# Patient Record
Sex: Female | Born: 1955 | Race: Black or African American | Hispanic: No | Marital: Single | Smoking: Former smoker
Health system: Southern US, Community
[De-identification: ages and names within clinical notes are randomized; demographics above are authoritative.]

## PROBLEM LIST (undated history)

## (undated) DIAGNOSIS — M1990 Unspecified osteoarthritis, unspecified site: Secondary | ICD-10-CM

## (undated) HISTORY — DX: Unspecified osteoarthritis, unspecified site: M19.90

---

## 1999-02-04 ENCOUNTER — Emergency Department (HOSPITAL_COMMUNITY): Admission: EM | Admit: 1999-02-04 | Discharge: 1999-02-04 | Payer: Self-pay | Admitting: Emergency Medicine

## 2001-02-03 ENCOUNTER — Emergency Department (HOSPITAL_COMMUNITY): Admission: EM | Admit: 2001-02-03 | Discharge: 2001-02-03 | Payer: Self-pay | Admitting: *Deleted

## 2001-03-14 ENCOUNTER — Emergency Department (HOSPITAL_COMMUNITY): Admission: EM | Admit: 2001-03-14 | Discharge: 2001-03-14 | Payer: Self-pay | Admitting: Emergency Medicine

## 2002-07-21 ENCOUNTER — Encounter: Payer: Self-pay | Admitting: Emergency Medicine

## 2002-07-21 ENCOUNTER — Emergency Department (HOSPITAL_COMMUNITY): Admission: EM | Admit: 2002-07-21 | Discharge: 2002-07-21 | Payer: Self-pay | Admitting: Emergency Medicine

## 2005-06-21 ENCOUNTER — Emergency Department (HOSPITAL_COMMUNITY): Admission: EM | Admit: 2005-06-21 | Discharge: 2005-06-21 | Payer: Self-pay | Admitting: Emergency Medicine

## 2006-02-05 ENCOUNTER — Ambulatory Visit: Payer: Self-pay | Admitting: Internal Medicine

## 2006-02-05 LAB — CONVERTED CEMR LAB
AST: 27 units/L (ref 0–37)
Albumin: 3.5 g/dL (ref 3.5–5.2)
Amylase: 67 units/L (ref 27–131)
BUN: 13 mg/dL (ref 6–23)
Basophils Absolute: 0 10*3/uL (ref 0.0–0.1)
Bilirubin, Direct: 0.1 mg/dL (ref 0.0–0.3)
Calcium: 8.8 mg/dL (ref 8.4–10.5)
Eosinophils Absolute: 0.3 10*3/uL (ref 0.0–0.6)
GFR calc non Af Amer: 71 mL/min
Glucose, Bld: 112 mg/dL — ABNORMAL HIGH (ref 70–99)
HCT: 41.1 % (ref 36.0–46.0)
Hemoglobin: 13.7 g/dL (ref 12.0–15.0)
Lipase: 27 units/L (ref 11.0–59.0)
Lymphocytes Relative: 28.1 % (ref 12.0–46.0)
MCHC: 33.5 g/dL (ref 30.0–36.0)
Neutro Abs: 3.7 10*3/uL (ref 1.4–7.7)
Neutrophils Relative %: 59.4 % (ref 43.0–77.0)
Potassium: 3.3 meq/L — ABNORMAL LOW (ref 3.5–5.1)
RDW: 13.6 % (ref 11.5–14.6)
WBC: 6.2 10*3/uL (ref 4.5–10.5)

## 2006-02-06 ENCOUNTER — Encounter: Payer: Self-pay | Admitting: Internal Medicine

## 2006-02-06 LAB — CONVERTED CEMR LAB
Ketones, ur: NEGATIVE mg/dL
Protein, ur: 30 mg/dL — AB
Specific Gravity, Urine: 1.036 — ABNORMAL HIGH (ref 1.005–1.03)
Urine Glucose: NEGATIVE mg/dL

## 2006-02-09 ENCOUNTER — Ambulatory Visit: Payer: Self-pay | Admitting: Internal Medicine

## 2006-03-19 ENCOUNTER — Ambulatory Visit: Payer: Self-pay | Admitting: Internal Medicine

## 2006-04-10 ENCOUNTER — Other Ambulatory Visit: Admission: RE | Admit: 2006-04-10 | Discharge: 2006-04-10 | Payer: Self-pay | Admitting: Gynecology

## 2009-04-05 ENCOUNTER — Emergency Department (HOSPITAL_COMMUNITY): Admission: EM | Admit: 2009-04-05 | Discharge: 2009-04-05 | Payer: Self-pay | Admitting: Family Medicine

## 2009-07-21 ENCOUNTER — Encounter: Payer: Self-pay | Admitting: Internal Medicine

## 2009-07-21 ENCOUNTER — Ambulatory Visit: Payer: Self-pay | Admitting: Internal Medicine

## 2009-07-21 ENCOUNTER — Other Ambulatory Visit: Admission: RE | Admit: 2009-07-21 | Discharge: 2009-07-21 | Payer: Self-pay | Admitting: Internal Medicine

## 2009-07-21 DIAGNOSIS — R631 Polydipsia: Secondary | ICD-10-CM | POA: Insufficient documentation

## 2009-07-21 DIAGNOSIS — M25559 Pain in unspecified hip: Secondary | ICD-10-CM | POA: Insufficient documentation

## 2009-07-21 LAB — HM PAP SMEAR

## 2009-08-03 ENCOUNTER — Encounter (INDEPENDENT_AMBULATORY_CARE_PROVIDER_SITE_OTHER): Payer: Self-pay | Admitting: *Deleted

## 2009-08-26 ENCOUNTER — Encounter: Payer: Self-pay | Admitting: Internal Medicine

## 2009-09-03 ENCOUNTER — Encounter (INDEPENDENT_AMBULATORY_CARE_PROVIDER_SITE_OTHER): Payer: Self-pay | Admitting: *Deleted

## 2009-09-08 ENCOUNTER — Ambulatory Visit: Payer: Self-pay | Admitting: Internal Medicine

## 2009-09-16 ENCOUNTER — Encounter: Payer: Self-pay | Admitting: Internal Medicine

## 2009-09-16 LAB — HM MAMMOGRAPHY: HM Mammogram: NORMAL

## 2009-09-21 ENCOUNTER — Telehealth: Payer: Self-pay | Admitting: Internal Medicine

## 2009-09-22 ENCOUNTER — Ambulatory Visit: Payer: Self-pay | Admitting: Internal Medicine

## 2009-09-22 LAB — HM COLONOSCOPY

## 2009-09-28 ENCOUNTER — Encounter: Payer: Self-pay | Admitting: Internal Medicine

## 2010-01-30 LAB — CONVERTED CEMR LAB
ALT: 13 units/L (ref 0–35)
AST: 17 units/L (ref 0–37)
Basophils Relative: 0.3 % (ref 0.0–3.0)
Bilirubin, Direct: 0.1 mg/dL (ref 0.0–0.3)
Chloride: 107 meq/L (ref 96–112)
GFR calc non Af Amer: 132.54 mL/min (ref >60)
Glucose, Bld: 86 mg/dL (ref 70–99)
HCT: 39.5 % (ref 36.0–46.0)
Hemoglobin, Urine: NEGATIVE
Hemoglobin: 13.2 g/dL (ref 12.0–15.0)
Ketones, ur: NEGATIVE mg/dL
Lymphocytes Relative: 40.1 % (ref 12.0–46.0)
MCHC: 33.3 g/dL (ref 30.0–36.0)
Monocytes Relative: 6.1 % (ref 3.0–12.0)
Neutro Abs: 4.9 10*3/uL (ref 1.4–7.7)
Nitrite: NEGATIVE
Pap Smear: NEGATIVE
Platelets: 450 10*3/uL — ABNORMAL HIGH (ref 150.0–400.0)
Potassium: 4.7 meq/L (ref 3.5–5.1)
RBC: 4.45 M/uL (ref 3.87–5.11)
Sodium: 138 meq/L (ref 135–145)
Specific Gravity, Urine: 1.02 (ref 1.000–1.030)
Total Protein, Urine: NEGATIVE mg/dL
Total Protein: 6.8 g/dL (ref 6.0–8.3)
Urobilinogen, UA: 1 (ref 0.0–1.0)
WBC: 9.6 10*3/uL (ref 4.5–10.5)

## 2010-02-03 NOTE — Letter (Signed)
Summary: Results Follow-up Letter  [facility]  [address] [place], [place] [address]   Phone: [phone]  Fax: [phone]    07/21/2009  [address] [place], [place]  [address]  Dear Ms. Laird,   The following are the results of your recent test(s):  Test     Result     Hip xray     normal CBC       slightly high platelet count Liver/kidney   normal Blood sugars   normal Thyroid     normal   _________________________________________________________  Please call for an appointment as directed _________________________________________________________ _________________________________________________________ _________________________________________________________  Sincerely,  Sanda Linger MD [facility]

## 2010-02-03 NOTE — Assessment & Plan Note (Signed)
Summary: NPX/AETNA/#/CD   Vital Signs:  Patient profile:   55 year old female Height:      64 inches Weight:      179.25 pounds BMI:     30.88 O2 Sat:      96 % on Room air Temp:     98.7 degrees F oral Pulse rate:   81 / minute Pulse rhythm:   regular Resp:     16 per minute BP sitting:   120 / 72  (left arm) Cuff size:   large  Vitals Entered By: Rock Nephew CMA (July 21, 2009 9:04 AM)  Nutrition Counseling: Patient's BMI is greater than 25 and therefore counseled on weight management options.  O2 Flow:  Room air  Primary Care Provider:  Etta Grandchild MD   History of Present Illness: New to me for a complete physical with a complaint of left hip pain for over a year with no preceding trauma or injury. She describes it as an intermittent severe ache that does not change with position or activity. The pain is located over the GT and it occasionally radiates into her left thigh. The pain resolves with OTC meds.   Preventive Screening-Counseling & Management  Alcohol-Tobacco     Alcohol drinks/day: 1     Alcohol type: all     >5/day in last 3 mos: no     Alcohol Counseling: not indicated; use of alcohol is not excessive or problematic     Feels need to cut down: no     Feels annoyed by complaints: no     Feels guilty re: drinking: no     Needs 'eye opener' in am: no     Smoking Status: current     Smoking Cessation Counseling: yes     Smoke Cessation Stage: precontemplative     Packs/Day: 1.0     Year Started: 1977     Pack years: 40     Tobacco Counseling: to quit use of tobacco products  Caffeine-Diet-Exercise     Does Patient Exercise: yes  Hep-HIV-STD-Contraception     Hepatitis Risk: no risk noted     HIV Risk: no risk noted     STD Risk: no risk noted     SBE monthly: yes     SBE Education/Counseling: to perform regular SBE  Safety-Violence-Falls     Seat Belt Use: yes     Helmet Use: yes     Firearms in the Home: no firearms in the home  Smoke Detectors: yes     Violence in the Home: no risk noted     Sexual Abuse: no      Sexual History:  currently monogamous.        Blood Transfusions:  no.    Current Medications (verified): 1)  Advil 200 Mg Tabs (Ibuprofen) .... 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 Current Smoker Alcohol use-yes Drug use-no Regular exercise-yes Does Patient Exercise:  yes Risk analyst Use:  yes Smoking Status:  current Packs/Day:  1.0 HIV Risk:  no risk noted Hepatitis Risk:  no risk noted STD Risk:  no risk noted Sexual History:  currently monogamous Blood Transfusions:  no  Review of Systems  The patient denies anorexia, fever, weight loss, weight gain, hoarseness, chest pain, syncope, dyspnea on exertion, peripheral edema, prolonged cough, headaches, hemoptysis, abdominal pain, melena, hematochezia, severe indigestion/heartburn, hematuria, suspicious skin lesions, enlarged lymph nodes, angioedema, and breast masses.   MS:  Complains of joint pain; denies joint redness, joint swelling, loss of strength, low back pain, mid back pain, muscle aches, muscle weakness, and stiffness. Endo:  Complains of excessive thirst; denies cold intolerance, excessive hunger, excessive urination, heat intolerance, polyuria, and weight change.  Physical Exam  General:  alert, well-developed, well-nourished, well-hydrated, appropriate dress, normal appearance, healthy-appearing, cooperative to examination, and good hygiene.  alert, well-developed, well-nourished, well-hydrated, appropriate dress, normal appearance,  healthy-appearing, cooperative to examination, and good hygiene.   Head:  normocephalic, atraumatic, no abnormalities observed, and no abnormalities palpated.  normocephalic, atraumatic, no abnormalities observed, and no abnormalities palpated.   Eyes:  vision grossly intact, pupils equal, pupils round, pupils reactive to light, and no injection.  vision grossly intact, pupils equal, pupils round, pupils reactive to light, and no injection.   Ears:  R ear normal and L ear normal.  R ear normal and L ear normal.   Nose:  External nasal examination shows no deformity or inflammation. Nasal mucosa are pink and moist without lesions or exudates. Mouth:  pharynx pink and moist, no exudates, no lesions, no erosions, and edentulous.  pharynx pink and moist, no exudates, no lesions, and no erosions.   Neck:  supple, full ROM, no masses, no thyromegaly, no thyroid nodules or tenderness, no JVD, normal carotid upstroke, no carotid bruits, no cervical lymphadenopathy, and no neck tenderness.  supple, full ROM, no masses, no thyromegaly, no thyroid nodules or tenderness, no JVD, normal carotid upstroke, no carotid bruits, no cervical lymphadenopathy, and no neck tenderness.   Chest Wall:  No deformities, masses, or tenderness noted. Breasts:  No mass, nodules, thickening, tenderness, bulging, retraction, inflamation, nipple discharge or skin changes noted.   Lungs:  Normal respiratory effort, chest expands symmetrically. Lungs are clear to auscultation, no crackles or wheezes. Heart:  Normal rate and regular rhythm. S1 and S2 normal without gallop, murmur, click, rub or other extra sounds. Abdomen:  soft, non-tender, normal bowel sounds, no distention, no masses, no guarding, no rigidity, no rebound tenderness, no abdominal hernia, no inguinal hernia, no hepatomegaly, and no splenomegaly.  soft, non-tender, normal bowel sounds, no distention, no masses, no guarding, no rigidity, no rebound tenderness, no abdominal  hernia, no inguinal hernia, no hepatomegaly, and no splenomegaly.   Rectal:  No external abnormalities noted. Normal sphincter tone. No rectal masses or tenderness. Genitalia:  normal introitus, no external lesions, no vaginal discharge, mucosa pink and moist, no vaginal or cervical lesions, no vaginal atrophy, no friaility or hemorrhage, normal uterus size and position, and no adnexal masses or tenderness.  normal introitus, no external lesions, no vaginal discharge, mucosa pink and moist, no vaginal or cervical lesions, no vaginal atrophy, no friaility or hemorrhage, normal uterus size and position, and no adnexal masses or tenderness.   Msk:  normal ROM, no joint tenderness, no joint swelling, no joint warmth, no redness over joints, no joint deformities, no joint instability, no crepitation, and no muscle atrophy.  normal ROM, no joint tenderness, no joint swelling, no joint warmth, no redness over joints, no joint deformities, no joint instability, no crepitation, and no muscle atrophy.   Pulses:  R and L carotid,radial,femoral,dorsalis pedis and posterior tibial pulses are full and  equal bilaterally Extremities:  No clubbing, cyanosis, edema, or deformity noted with normal full range of motion of all joints.   Neurologic:  No cranial nerve deficits noted. Station and gait are normal. Plantar reflexes are down-going bilaterally. DTRs are symmetrical throughout. Sensory, motor and coordinative functions appear intact. Skin:  turgor normal, color normal, no rashes, no suspicious lesions, no ecchymoses, no petechiae, no purpura, no ulcerations, no edema, and tattoo(s).  turgor normal, color normal, no rashes, no suspicious lesions, no ecchymoses, no petechiae, no purpura, no ulcerations, and no edema.   Cervical Nodes:  no anterior cervical adenopathy and no posterior cervical adenopathy.  no anterior cervical adenopathy and no posterior cervical adenopathy.   Axillary Nodes:  no R axillary adenopathy and  no L axillary adenopathy.  no R axillary adenopathy and no L axillary adenopathy.   Inguinal Nodes:  no R inguinal adenopathy and no L inguinal adenopathy.  no R inguinal adenopathy and no L inguinal adenopathy.   Psych:  Cognition and judgment appear intact. Alert and cooperative with normal attention span and concentration. No apparent delusions, illusions, hallucinations   Impression & Recommendations:  Problem # 1:  TOBACCO USE (ICD-305.1) Assessment New  Orders: Tobacco use cessation intermediate 3-10 minutes (04540)  Encouraged smoking cessation and discussed different methods for smoking cessation.   Problem # 2:  ROUTINE GENERAL MEDICAL EXAM@HEALTH  CARE FACL (ICD-V70.0)  Orders: Venipuncture (98119) TLB-Lipid Panel (80061-LIPID) TLB-BMP (Basic Metabolic Panel-BMET) (80048-METABOL) TLB-CBC Platelet - w/Differential (85025-CBCD) TLB-Hepatic/Liver Function Pnl (80076-HEPATIC) TLB-TSH (Thyroid Stimulating Hormone) (84443-TSH) TLB-A1C / Hgb A1C (Glycohemoglobin) (83036-A1C) TLB-Udip w/ Micro (81001-URINE) Hemoccult Guaiac-1 spec.(in office) (82270) EKG w/ Interpretation (93000) Radiology Referral (Radiology) Gastroenterology Referral (GI)  Discussed using sunscreen, use of alcohol, drug use, self breast exam, routine dental care, routine eye care, schedule for GYN exam, routine physical exam, seat belts, multiple vitamins, osteoporosis prevention, adequate calcium intake in diet, recommendations for immunizations, mammograms and Pap smears.  Discussed exercise and checking cholesterol.   Problem # 3:  POLYDIPSIA (ICD-783.5) Assessment: New  Orders: Venipuncture (14782) TLB-Lipid Panel (80061-LIPID) TLB-BMP (Basic Metabolic Panel-BMET) (80048-METABOL) TLB-CBC Platelet - w/Differential (85025-CBCD) TLB-Hepatic/Liver Function Pnl (80076-HEPATIC) TLB-TSH (Thyroid Stimulating Hormone) (84443-TSH) TLB-A1C / Hgb A1C (Glycohemoglobin) (83036-A1C) TLB-Udip w/ Micro  (81001-URINE)  Problem # 4:  HIP PAIN, LEFT (ICD-719.45) Assessment: New  Her updated medication list for this problem includes:    Advil 200 Mg Tabs (Ibuprofen) .Marland Kitchen... As needed  Orders: Venipuncture (95621) TLB-Lipid Panel (80061-LIPID) TLB-BMP (Basic Metabolic Panel-BMET) (80048-METABOL) TLB-CBC Platelet - w/Differential (85025-CBCD) TLB-Hepatic/Liver Function Pnl (80076-HEPATIC) TLB-TSH (Thyroid Stimulating Hormone) (84443-TSH) TLB-A1C / Hgb A1C (Glycohemoglobin) (83036-A1C) TLB-Udip w/ Micro (81001-URINE) Tobacco use cessation intermediate 3-10 minutes (30865) T-Hip Comp Left Min 2-views (73510TC)  Complete Medication List: 1)  Advil 200 Mg Tabs (Ibuprofen) .... As needed  Colorectal Screening:  Current Recommendations:    Hemoccult: NEG X 1 today    Colonoscopy recommended: scheduled with G.I.  PAP Screening:    Reviewed PAP smear recommendations:  PAP smear done  Mammogram Screening:    Reviewed Mammogram recommendations:  mammogram ordered  Osteoporosis Risk Assessment:  Risk Factors for Fracture or Low Bone Density:   Smoking status:       current  Patient Instructions: 1)  Please schedule a follow-up appointment in 1 month. 2)  Tobacco is very bad for your health and your loved ones! You Should stop smoking!. 3)  Stop Smoking Tips: Choose a Quit date. Cut down before the Quit date. decide what you will  do as a substitute when you feel the urge to smoke(gum,toothpick,exercise). 4)  It is important that you exercise regularly at least 20 minutes 5 times a week. If you develop chest pain, have severe difficulty breathing, or feel very tired , stop exercising immediately and seek medical attention. 5)  It is not healthy  for men to drink more than 2-3 drinks per day or for women to drink more than 1-2 drinks per day.

## 2010-02-03 NOTE — Letter (Signed)
Summary: Lipid Letter  [facility]  [address] [place], [place] [address]   Phone: [phone]  Fax: [phone]    07/21/2009  Melanie Ewing [address] [place], [place]  [address]  Dear Melanie Ewing:  We have carefully reviewed your last lipid profile from 07/21/2009 and the results are noted below with a summary of recommendations for lipid management.    Cholesterol:       170     Goal: <200   HDL "good" Cholesterol:   57.84     Goal: >40   LDL "bad" Cholesterol:   108     Goal: <130   Triglycerides:       55.0     Goal: <150        TLC Diet (Therapeutic Lifestyle Change): Saturated Fats & Transfatty acids should be kept < 7% of total calories ***Reduce Saturated Fats Polyunstaurated Fat can be up to 10% of total calories Monounsaturated Fat Fat can be up to 20% of total calories Total Fat should be no greater than 25-35% of total calories Carbohydrates should be 50-60% of total calories Protein should be approximately 15% of total calories Fiber should be at least 20-30 grams a day ***Increased fiber may help lower LDL Total Cholesterol should be < 200mg /day Consider adding plant stanol/sterols to diet (example: Benacol spread) ***A higher intake of unsaturated fat may reduce Triglycerides and Increase HDL    Adjunctive Measures (may lower LIPIDS and reduce risk of Heart Attack) include: Aerobic Exercise (20-30 minutes 3-4 times a week) Limit Alcohol Consumption Weight Reduction Aspirin 75-81 mg a day by mouth (if not allergic or contraindicated) Dietary Fiber 20-30 grams a day by mouth     Current Medications: 1)    Advil 200 Mg Tabs (Ibuprofen) .... As needed  If you have any questions, please call. We appreciate being able to work with you.   Sincerely,    [facility] Etta Grandchild MD

## 2010-02-03 NOTE — Miscellaneous (Signed)
Summary: [facility] Previsit/prep  Clinical Lists Changes  Medications: Added new medication of MOVIPREP 100 GM  SOLR (PEG-KCL-NACL-NASULF-NA ASC-C) As per prep instructions. - Signed Rx of MOVIPREP 100 GM  SOLR (PEG-KCL-NACL-NASULF-NA ASC-C) As per prep instructions.;  #1 x 0;  Signed;  Entered by: Emerson Monte RN;  Authorized by: Gatha Mayer MD, [facility];  Method used: Electronically to [facility] [id number]*, [address], [place], [place]  [address], Ph: [phone], Fax: [phone] Observations: Added new observation of NKA: T (09/08/2009 8:07)    Prescriptions: MOVIPREP 100 GM  SOLR (PEG-KCL-NACL-NASULF-NA ASC-C) As per prep instructions.  #1 x 0   Entered by:   Emerson Monte RN   Authorized by:   Gatha Mayer MD, [facility]   Signed by:   Emerson Monte RN on 09/08/2009   Method used:   Electronically to        [facility] [phone]* (retail)       [address]       [place], [place]  [address]       Ph: [phone]       Fax: [phone]   RxID:   [id number]

## 2010-02-03 NOTE — Progress Notes (Signed)
    PAP Screening:    Last PAP smear:  07/21/2009  Mammogram Screening:    Last Mammogram:  09/16/2009  Mammogram Results:    Date of Exam:  09/16/2009    Results:  Normal Bilateral  Osteoporosis Risk Assessment:  Risk Factors for Fracture or Low Bone Density:   Smoking status:       current

## 2010-02-03 NOTE — Procedures (Signed)
Summary: Colonoscopy  Patient: Melanie Ewing Note: All result statuses are Final unless otherwise noted.  Tests: (1) Colonoscopy (COL)   COL Colonoscopy           DONE     [facility]     [address]     [place], [place]  [address]           COLONOSCOPY PROCEDURE REPORT           PATIENT:  Melanie Ewing, Melanie Ewing  MR#:  [id number]     BIRTHDATE:  Jul 29, 1955, 54 yrs. old  GENDER:  female     ENDOSCOPIST:  Iva Boop, MD, [facility]     REF. BY:  Etta Grandchild, M.D.     PROCEDURE DATE:  09/22/2009     PROCEDURE:  Colonoscopy with biopsy and snare polypectomy     ASA CLASS:  Class I     INDICATIONS:  Routine Risk Screening     MEDICATIONS:   Fentanyl 50 mcg IV, Versed 7 mg IV           DESCRIPTION OF PROCEDURE:   After the risks benefits and     alternatives of the procedure were thoroughly explained, informed     consent was obtained.  Digital rectal exam was performed and     revealed no abnormalities.   The LB [id number] endoscope     was introduced through the anus and advanced to the cecum, which     was identified by both the appendix and ileocecal valve, without     limitations.  The quality of the prep was excellent, using     MoviPrep.  The instrument was then slowly withdrawn as the colon     was fully examined. Insertion: 2:29 minutes withdrawal: 11:02     minutes     <<PROCEDUREIMAGES>>           FINDINGS:  Two polyps were found. They  were diminutive. Splenic     flexure (? polyp) removed with biopsy forceps (partially     recovered) and sigmoid removed via cold snare and recovered.  This     was otherwise a normal examination of the colon. This includes     right colon retroflexion.   Retroflexed views in the rectum     revealed internal hemorrhoids.    The scope was then withdrawn     from the patient and the procedure completed.           COMPLICATIONS:  None     ENDOSCOPIC IMPRESSION:     1) Two diminutive left colon polyps removed     2)  Internal hemorrhoids     3) Otherwise normal examination, excellent prep           REPEAT EXAM:  In for Colonoscopy, pending biopsy results.           Iva Boop, MD, [facility]           CC:  Etta Grandchild, MD     The Patient           n.     Rosalie Doctor:   Iva Boop at 09/22/2009 10:22 AM           Jarvis Newcomer, [id number]  Note: An exclamation mark (!) indicates a result that was not dispersed into the flowsheet. Document Creation Date: 09/22/2009 10:23 AM _______________________________________________________________________  (1) Order result status: Final Collection or observation date-time: 09/22/2009 10:13 Requested date-time:  Receipt date-time:  Reported date-time:  Referring Physician:   Ordering Physician: Stan Head [phone]) Specimen Source:  Source: Launa Grill Order Number: [id number] Lab site:   Appended Document: Colonoscopy     Procedures Next Due Date:    Colonoscopy: 10/2019

## 2010-02-03 NOTE — Letter (Signed)
Summary: Previsit letter  [facility]  [place], [place] [address]   Phone: [phone]  Fax: [phone]       08/03/2009 MRN: [id number]  [facility] [address] [place], [place]  [address]  Dear Melanie Ewing,  Welcome to the Gastroenterology Division at [facility].    You are scheduled to see a nurse for your pre-procedure visit on September 08, 2009 at 8:30am on the 3rd floor at [facility] [place] [facility].  We ask that you try to arrive at our office 15 minutes prior to your appointment time to allow for check-in.  Your nurse visit will consist of discussing your medical and surgical history, your immediate family medical history, and your medications.    Please bring a complete list of all your medications or, if you prefer, bring the medication bottles and we will list them.  We will need to be aware of both prescribed and over the counter drugs.  We will need to know exact dosage information as well.  If you are on blood thinners (Coumadin, Plavix, Aggrenox, Ticlid, etc.) please call our office today/prior to your appointment, as we need to consult with your physician about holding your medication.   Please be prepared to read and sign documents such as consent forms, a financial agreement, and acknowledgement forms.  If necessary, and with your consent, a friend or relative is welcome to sit-in on the nurse visit with you.  Please bring your insurance card so that we may make a copy of it.  If your insurance requires a referral to see a specialist, please bring your referral form from your primary care physician.  No co-pay is required for this nurse visit.     If you cannot keep your appointment, please call [phone] to cancel or reschedule prior to your appointment date.  This allows [place] the opportunity to schedule an appointment for another patient in need of care.    Thank you for choosing [facility] for  your medical needs.  We appreciate the opportunity to care for you.  Please visit [place] at our website  to learn more about our practice.                     Sincerely.                                                                                                                   The Gastroenterology Division

## 2010-02-03 NOTE — Letter (Signed)
Summary: Patient Notice-Hyperplastic Polyps  [facility]  [address] [place], [place] [address]   Phone: [phone]  Fax: [phone]        September 28, 2009 MRN: [id number]    [facility] [address] 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  These types of polyps are NOT pre-cancerous.  It is therefore my recommendation that you have a repeat colonoscopy examination in 10 years for routine colorectal cancer screening.  Should you develop new or worsening symptoms of abdominal pain, bowel habit changes or bleeding from the rectum or bowels, please schedule an evaluation with either your primary care physician or with me.  Please call us if you are having persistent problems or have questions about your condition that have not been fully answered at this time.  Sincerely,  Iva Boop MD, [facility] This letter has been electronically signed by your physician.  Appended Document: Patient Notice-Hyperplastic Polyps letter mailed

## 2010-02-03 NOTE — Letter (Signed)
Summary: [facility] Instructions  [facility]  [address] [place], [place] [address]   Phone: [phone]  Fax: [phone]       Melanie Ewing    07-29-1955    MRN: [id number]        Procedure Day Dorna Bloom: Wednesday 09-22-09     Arrival Time: 8:30 a.m.     Procedure Time: 9:30 a.m.     Location of Procedure:                    _x _  [facility] (4th Floor)                       PREPARATION FOR COLONOSCOPY WITH MOVIPREP   Starting 5 days prior to your procedure  09-17-09 do not eat nuts, seeds, popcorn, corn, beans, peas,  salads, or any raw vegetables.  Do not take any fiber supplements (e.g. Metamucil, Citrucel, and Benefiber).  THE DAY BEFORE YOUR PROCEDURE         DATE:  09-21-09  DAY: Tuesday  1.  Drink clear liquids the entire day-NO SOLID FOOD  2.  Do not drink anything colored red or purple.  Avoid juices with pulp.  No orange juice.  3.  Drink at least 64 oz. (8 glasses) of fluid/clear liquids during the day to prevent dehydration and help the prep work efficiently.  CLEAR LIQUIDS INCLUDE: Water Jello Ice Popsicles Tea (sugar ok, no milk/cream) Powdered fruit flavored drinks Coffee (sugar ok, no milk/cream) Gatorade Juice: apple, white grape, white cranberry  Lemonade Clear bullion, consomm, broth Carbonated beverages (any kind) Strained chicken noodle soup Hard Candy                             4.  In the morning, mix first dose of MoviPrep solution:    Empty 1 Pouch A and 1 Pouch B into the disposable container    Add lukewarm drinking water to the top line of the container. Mix to dissolve    Refrigerate (mixed solution should be used within 24 hrs)  5.  Begin drinking the prep at 5:00 p.m. The MoviPrep container is divided by 4 marks.   Every 15 minutes drink the solution down to the next mark (approximately 8 oz) until the full liter is complete.   6.  Follow completed prep with 16 oz of clear liquid of your choice  (Nothing red or purple).  Continue to drink clear liquids until bedtime.  7.  Before going to bed, mix second dose of MoviPrep solution:    Empty 1 Pouch A and 1 Pouch B into the disposable container    Add lukewarm drinking water to the top line of the container. Mix to dissolve    Refrigerate  THE DAY OF YOUR PROCEDURE      DATE:  09-22-09   DAY: Wednesday  Beginning at  4:30 a.m. (5 hours before procedure):         1. Every 15 minutes, drink the solution down to the next mark (approx 8 oz) until the full liter is complete.  2. Follow completed prep with 16 oz. of clear liquid of your choice.    3. You may drink clear liquids until  7:30 a.m.  (2 HOURS BEFORE PROCEDURE).   MEDICATION INSTRUCTIONS  Unless otherwise instructed, you should take regular prescription medications with a small sip of water  as early as possible the morning of your procedure.         OTHER INSTRUCTIONS  You will need a responsible adult at least 53 years of age to accompany you and drive you home.   This person must remain in the waiting room during your procedure.  Wear loose fitting clothing that is easily removed.  Leave jewelry and other valuables at home.  However, you may wish to bring a book to read or  an iPod/MP3 player to listen to music as you wait for your procedure to start.  Remove all body piercing jewelry and leave at home.  Total time from sign-in until discharge is approximately 2-3 hours.  You should go home directly after your procedure and rest.  You can resume normal activities the  day after your procedure.  The day of your procedure you should not:   Drive   Make legal decisions   Operate machinery   Drink alcohol   Return to work  You will receive specific instructions about eating, activities and medications before you leave.    The above instructions have been reviewed and explained to me by  Wyona Almas RN  September 08, 2009 9:01 AM     I  fully understand and can verbalize these instructions _____________________________ Date _________

## 2010-06-22 ENCOUNTER — Encounter: Payer: Self-pay | Admitting: Internal Medicine

## 2010-06-23 ENCOUNTER — Encounter: Payer: Self-pay | Admitting: Internal Medicine

## 2010-06-23 ENCOUNTER — Ambulatory Visit (INDEPENDENT_AMBULATORY_CARE_PROVIDER_SITE_OTHER): Payer: Managed Care, Other (non HMO) | Admitting: Internal Medicine

## 2010-06-23 ENCOUNTER — Other Ambulatory Visit (INDEPENDENT_AMBULATORY_CARE_PROVIDER_SITE_OTHER): Payer: Managed Care, Other (non HMO)

## 2010-06-23 DIAGNOSIS — R631 Polydipsia: Secondary | ICD-10-CM

## 2010-06-23 DIAGNOSIS — H9190 Unspecified hearing loss, unspecified ear: Secondary | ICD-10-CM | POA: Insufficient documentation

## 2010-06-23 DIAGNOSIS — D75839 Thrombocytosis, unspecified: Secondary | ICD-10-CM

## 2010-06-23 DIAGNOSIS — R109 Unspecified abdominal pain: Secondary | ICD-10-CM

## 2010-06-23 DIAGNOSIS — F17200 Nicotine dependence, unspecified, uncomplicated: Secondary | ICD-10-CM

## 2010-06-23 DIAGNOSIS — D473 Essential (hemorrhagic) thrombocythemia: Secondary | ICD-10-CM

## 2010-06-23 DIAGNOSIS — Z23 Encounter for immunization: Secondary | ICD-10-CM

## 2010-06-23 LAB — CBC WITH DIFFERENTIAL/PLATELET
Basophils Absolute: 0.1 10*3/uL (ref 0.0–0.1)
HCT: 40.8 % (ref 36.0–46.0)
Lymphocytes Relative: 40.9 % (ref 12.0–46.0)
Lymphs Abs: 4.2 10*3/uL — ABNORMAL HIGH (ref 0.7–4.0)
Monocytes Relative: 6.9 % (ref 3.0–12.0)
Neutrophils Relative %: 49.5 % (ref 43.0–77.0)
Platelets: 455 10*3/uL — ABNORMAL HIGH (ref 150.0–400.0)
RDW: 15.9 % — ABNORMAL HIGH (ref 11.5–14.6)

## 2010-06-23 LAB — URINALYSIS, ROUTINE W REFLEX MICROSCOPIC
Bilirubin Urine: NEGATIVE
Leukocytes, UA: NEGATIVE
Urine Glucose: NEGATIVE
Urobilinogen, UA: 1 (ref 0.0–1.0)
pH: 6 (ref 5.0–8.0)

## 2010-06-23 LAB — COMPREHENSIVE METABOLIC PANEL
ALT: 22 U/L (ref 0–35)
AST: 17 U/L (ref 0–37)
Calcium: 9.6 mg/dL (ref 8.4–10.5)
Chloride: 107 mEq/L (ref 96–112)
Creatinine, Ser: 0.8 mg/dL (ref 0.4–1.2)
Sodium: 141 mEq/L (ref 135–145)

## 2010-06-23 LAB — HEMOGLOBIN A1C: Hgb A1c MFr Bld: 6.2 % (ref 4.6–6.5)

## 2010-06-23 LAB — TSH: TSH: 1.13 u[IU]/mL (ref 0.35–5.50)

## 2010-06-23 NOTE — Progress Notes (Signed)
Subjective:    Patient ID: Melanie Ewing, female    DOB: 06-20-1955, 55 y.o.   MRN: [id number]  Flank Pain This is a chronic problem. The current episode started more than 1 month ago. The problem occurs intermittently. The problem has been gradually worsening since onset. The quality of the pain is described as aching and stabbing. The pain does not radiate. The pain is at a severity of 3/10. The pain is mild. The pain is worse during the night. The symptoms are aggravated by lying down. Pertinent negatives include no abdominal pain, bladder incontinence, bowel incontinence, chest pain, dysuria, fever, headaches, leg pain, numbness, paresis, paresthesias, pelvic pain, perianal numbness, tingling, weakness or weight loss.      Review of Systems  Constitutional: Negative for fever, chills, weight loss, diaphoresis, activity change, appetite change, fatigue and unexpected weight change.  HENT: Positive for hearing loss and tinnitus. Negative for ear pain, nosebleeds, congestion, sore throat, facial swelling, rhinorrhea, sneezing, drooling, mouth sores, trouble swallowing, neck pain, neck stiffness, dental problem, voice change, postnasal drip, sinus pressure and ear discharge.   Eyes: Negative for photophobia and visual disturbance.  Respiratory: Negative for apnea, cough, choking, chest tightness, shortness of breath, wheezing and stridor.   Cardiovascular: Negative for chest pain, palpitations and leg swelling.  Gastrointestinal: Negative for nausea, vomiting, abdominal pain, diarrhea, constipation, blood in stool, abdominal distention, anal bleeding, rectal pain and bowel incontinence.  Genitourinary: Positive for frequency and flank pain. Negative for bladder incontinence, dysuria, urgency, hematuria, decreased urine volume, vaginal bleeding, vaginal discharge, enuresis, difficulty urinating, genital sores, vaginal pain, menstrual problem, pelvic pain and dyspareunia.  Musculoskeletal:  Negative for myalgias, back pain, joint swelling, arthralgias and gait problem.  Skin: Negative for color change, pallor, rash and wound.  Neurological: Negative for dizziness, tingling, tremors, seizures, syncope, facial asymmetry, speech difficulty, weakness, light-headedness, numbness, headaches and paresthesias.  Hematological: Negative for adenopathy. Does not bruise/bleed easily.  Psychiatric/Behavioral: Negative.        Objective:   Physical Exam  Vitals reviewed. Constitutional: She is oriented to person, place, and time. She appears well-developed and well-nourished. No distress.  HENT:  Head: Normocephalic and atraumatic.  Right Ear: External ear normal.  Left Ear: External ear normal.  Nose: Nose normal.  Mouth/Throat: Oropharynx is clear and moist. No oropharyngeal exudate.  Eyes: Conjunctivae and EOM are normal. Pupils are equal, round, and reactive to light. Right eye exhibits no discharge. Left eye exhibits no discharge. No scleral icterus.  Neck: Normal range of motion. Neck supple. No JVD present. No tracheal deviation present. No thyromegaly present.  Cardiovascular: Normal rate, regular rhythm, normal heart sounds and intact distal pulses.  Exam reveals no gallop and no friction rub.   No murmur heard. Pulmonary/Chest: Effort normal and breath sounds normal. No stridor. No respiratory distress. She has no wheezes. She has no rales. She exhibits no tenderness.  Abdominal: Soft. Bowel sounds are normal. She exhibits no distension and no mass. There is no tenderness. There is no rebound and no guarding.  Musculoskeletal: Normal range of motion. She exhibits no edema and no tenderness.  Lymphadenopathy:    She has no cervical adenopathy.  Neurological: She is alert and oriented to person, place, and time. She has normal reflexes. She displays normal reflexes. No cranial nerve deficit. She exhibits normal muscle tone. Coordination normal.  Skin: Skin is warm and dry. No rash  noted. She is not diaphoretic. No erythema. No pallor.  Psychiatric: She has a normal mood  and affect. Her behavior is normal. Judgment and thought content normal.        Lab Results  Component Value Date   WBC 9.6 07/21/2009   HGB 13.2 07/21/2009   HCT 39.5 07/21/2009   PLT 450.0* 07/21/2009   CHOL 170 07/21/2009   TRIG 55.0 07/21/2009   HDL 50.90 07/21/2009   ALT 13 07/21/2009   AST 17 07/21/2009   NA 138 07/21/2009   K 4.7 07/21/2009   CL 107 07/21/2009   CREATININE 0.6 07/21/2009   BUN 15 07/21/2009   CO2 25 07/21/2009   TSH 1.14 07/21/2009   HGBA1C 6.0 07/21/2009    Assessment & Plan:

## 2010-06-23 NOTE — Patient Instructions (Signed)
Flank Pain Flank pain refers to pain that is located on the side of the body between the upper abdomen and the back. It can be caused by many things. CAUSES Some of the more common causes of flank pain include:  Muscle strain.   Muscle spasms.   A disease of your spine (vertebral disk disease).   A lung infection (pneumonia).   Fluid around your lungs (pulmonary edema).   A kidney infection.   Kidney stones.   A very painful skin rash on only one side of your body (shingles).   Gallbladder disease.  DIAGNOSIS Blood tests, urine tests, and X-rays may help your caregiver determine what is wrong. TREATMENT The treatment of pain depends on the cause. Your caregiver will determine what treatment will work best for you. HOME CARE INSTRUCTIONS  Home care will depend on the cause of your pain.   Some medications may help relieve the pain. Take medication for relief of pain as directed by your caregiver.   Tell your caregiver about any changes in your pain.   Follow up with your caregiver.  SEEK IMMEDIATE MEDICAL CARE IF:  Your pain is not controlled with medication.   The pain increases.   You have abdominal pain.   You have shortness of breath.   You have persistent nausea or vomiting.   You have swelling in your abdomen.   You feel faint or pass out.   You have a temperature by mouth above 100.5, not controlled by medicine.  MAKE SURE YOU:  Understand these instructions.   Will watch your condition.   Will get help right away if you are not doing well or get worse.  Document Released: 02/09/2005 Document Re-Released: 06/08/2009 [facility] Patient Information 2011 [place], [place].

## 2010-06-27 LAB — PROTEIN ELECTROPHORESIS, SERUM
Albumin ELP: 55.9 % (ref 55.8–66.1)
Alpha-1-Globulin: 4.6 % (ref 2.9–4.9)
Alpha-2-Globulin: 9.4 % (ref 7.1–11.8)
Gamma Globulin: 17.8 % (ref 11.1–18.8)

## 2010-06-28 ENCOUNTER — Telehealth: Payer: Self-pay

## 2010-06-28 NOTE — Telephone Encounter (Signed)
Patient notified of lab results per Md 

## 2010-06-28 NOTE — Telephone Encounter (Signed)
Message copied by Sandi Mealy on Tue Jun 28, 2010  9:17 AM ------      Message from: Rene Paci A      Created: Fri Jun 24, 2010  4:01 PM       Reviewed for urgency - Please call patient: tests show normal or stable results. No treatment changes recommended. - thanks.

## 2010-06-28 NOTE — Progress Notes (Signed)
Patient notified of lab results per Md

## 2010-07-02 NOTE — Assessment & Plan Note (Signed)
Labs ordered to look for other causes of this

## 2010-07-02 NOTE — Assessment & Plan Note (Signed)
This is suspicious for pathology in the spleen/kidneys, etc so today I will check labs and ask her to do a CT scan with contrast to look at the size and texture of her spleen and to look for stones in her kidneys

## 2010-07-02 NOTE — Assessment & Plan Note (Signed)
I will check labs to see what the trend of the platelet count is and will look at a SPEP to screen her for lymphoproliferative disease

## 2010-07-02 NOTE — Assessment & Plan Note (Signed)
Audiology referral for formal eval of her hearing

## 2010-07-07 ENCOUNTER — Other Ambulatory Visit: Payer: Self-pay | Admitting: Internal Medicine

## 2010-07-07 ENCOUNTER — Ambulatory Visit (INDEPENDENT_AMBULATORY_CARE_PROVIDER_SITE_OTHER)
Admission: RE | Admit: 2010-07-07 | Discharge: 2010-07-07 | Disposition: A | Discharge status: Home / Self Care | Payer: Managed Care, Other (non HMO) | Source: Ambulatory Visit | Attending: Internal Medicine | Admitting: Internal Medicine

## 2010-07-07 DIAGNOSIS — D473 Essential (hemorrhagic) thrombocythemia: Secondary | ICD-10-CM

## 2010-07-07 DIAGNOSIS — D75839 Thrombocytosis, unspecified: Secondary | ICD-10-CM

## 2010-07-07 DIAGNOSIS — R109 Unspecified abdominal pain: Secondary | ICD-10-CM

## 2010-07-07 MED ORDER — IOHEXOL 300 MG/ML  SOLN: 100.0000 mL | Freq: Once | INTRAMUSCULAR | Status: AC | PRN

## 2010-07-13 ENCOUNTER — Encounter: Payer: Self-pay | Admitting: Internal Medicine

## 2011-08-28 ENCOUNTER — Emergency Department (INDEPENDENT_AMBULATORY_CARE_PROVIDER_SITE_OTHER)
Admission: EM | Admit: 2011-08-28 | Discharge: 2011-08-28 | Disposition: A | Discharge status: Home / Self Care | Payer: Self-pay | Source: Home / Self Care | Attending: Emergency Medicine | Admitting: Emergency Medicine

## 2011-08-28 ENCOUNTER — Encounter (HOSPITAL_COMMUNITY): Payer: Self-pay

## 2011-08-28 ENCOUNTER — Emergency Department (INDEPENDENT_AMBULATORY_CARE_PROVIDER_SITE_OTHER): Payer: Self-pay

## 2011-08-28 DIAGNOSIS — S8263XA Displaced fracture of lateral malleolus of unspecified fibula, initial encounter for closed fracture: Secondary | ICD-10-CM

## 2011-08-28 MED ORDER — HYDROCODONE-ACETAMINOPHEN 5-325 MG PO TABS: ORAL_TABLET | ORAL | Status: AC

## 2011-08-28 NOTE — Progress Notes (Signed)
Orthopedic Tech Progress Note Patient Details:  Melanie Ewing [birth year] [id number] Short Leg splint applied to Left LE--stirrup splint Ortho Devices Type of Ortho Device: Stirrup splint Ortho Device/Splint Location: Applied to Left LE Ortho Device/Splint Interventions: Application   Asia R Thompson 08/28/2011, 2:27 PM

## 2011-08-28 NOTE — ED Notes (Signed)
Pt states she slipped on her garage stairs on 08/06/11 and injured lt ankle.  C/o pain and swelling to lt ankle and pain shooting up into lower leg.

## 2011-08-28 NOTE — ED Provider Notes (Signed)
Chief Complaint  Patient presents with  . Ankle Pain    History of Present Illness:   Melanie Ewing is a 56 year old female with a 21 day history of left ankle pain. This began when she slipped on a couple of steps and fell. She's had swelling and pain to palpation over the lateral malleolus, although she has been able to ambulate. She denies any numbness or tingling.  Review of Systems:  Other than noted above, the patient denies any of the following symptoms: Systemic:  No fevers, chills, sweats, or aches.  No fatigue or tiredness. Musculoskeletal:  No joint pain, arthritis, bursitis, swelling, back pain, or neck pain. Neurological:  No muscular weakness, paresthesias, headache, or trouble with speech or coordination.  No dizziness.  PMFSH:  Past medical history, family history, social history, meds, and allergies were reviewed.  Physical Exam:   Vital signs:  BP 143/79  Pulse 64  Temp 98.1 F (36.7 C) (Oral)  Resp 16  SpO2 100% Gen:  Alert and oriented times 3.  In no distress. Musculoskeletal: There is pain to palpation over the lateral malleolus but not much swelling. The ankle has a full range of motion with slight pain. Talar tilt and anterior drawer signs were negative. Otherwise, all joints had a full a ROM with no swelling, bruising or deformity.  No edema, pulses full. Extremities were warm and pink.  Capillary refill was brisk.  Skin:  Clear, warm and dry.  No rash. Neuro:  Alert and oriented times 3.  Muscle strength was normal.  Sensation was intact to light touch.   Radiology:  Dg Ankle Complete Left  08/28/2011  *RADIOLOGY REPORT*  Clinical Data: Lateral malleolar pain, fell 08/06/2011  LEFT ANKLE COMPLETE - 3+ VIEW  Comparison: None  Findings: Osseous mineralization normal. Soft tissue swelling at lateral ankle. Minimally displaced oblique fracture of the lateral malleolus. Ankle mortise intact. No additional fracture or dislocation identified. Tiny plantar calcaneal spurs.   IMPRESSION: Minimally displaced oblique fracture of the lateral malleolus.   Original Report Authenticated By: Lollie Marrow, M.D.      I reviewed the images independently and personally and concur with the radiologist's findings.  Course in Urgent Care Center:   She was placed in a short leg stirrup splint and given crutches for ambulation.  Assessment:  The encounter diagnosis was Fractured lateral malleolus.  Plan:   1.  The following meds were prescribed:   New Prescriptions   HYDROCODONE-ACETAMINOPHEN (NORCO/VICODIN) 5-325 MG PER TABLET    1 to 2 tabs every 4 to 6 hours as needed for pain.   2.  The patient was instructed in symptomatic care, including rest and activity, elevation, application of ice and compression.  Appropriate handouts were given. 3.  The patient was told to return if becoming worse in any way, if no better in 3 or 4 days, and given some red flag symptoms that would indicate earlier return.   4.  The patient was told to follow up with Dr. Leonides Grills in one week.   Reuben Likes, MD 08/28/11 2149

## 2011-09-14 ENCOUNTER — Emergency Department (HOSPITAL_COMMUNITY)
Admission: EM | Admit: 2011-09-14 | Discharge: 2011-09-14 | Disposition: A | Discharge status: Home / Self Care | Payer: Self-pay | Attending: Emergency Medicine | Admitting: Emergency Medicine

## 2011-09-14 ENCOUNTER — Encounter (HOSPITAL_COMMUNITY): Payer: Self-pay | Admitting: *Deleted

## 2011-09-14 DIAGNOSIS — F17200 Nicotine dependence, unspecified, uncomplicated: Secondary | ICD-10-CM | POA: Insufficient documentation

## 2011-09-14 DIAGNOSIS — Z8261 Family history of arthritis: Secondary | ICD-10-CM | POA: Insufficient documentation

## 2011-09-14 DIAGNOSIS — S93609A Unspecified sprain of unspecified foot, initial encounter: Secondary | ICD-10-CM

## 2011-09-14 DIAGNOSIS — Z8249 Family history of ischemic heart disease and other diseases of the circulatory system: Secondary | ICD-10-CM | POA: Insufficient documentation

## 2011-09-14 DIAGNOSIS — X58XXXA Exposure to other specified factors, initial encounter: Secondary | ICD-10-CM | POA: Insufficient documentation

## 2011-09-14 DIAGNOSIS — Z823 Family history of stroke: Secondary | ICD-10-CM | POA: Insufficient documentation

## 2011-09-14 DIAGNOSIS — Z833 Family history of diabetes mellitus: Secondary | ICD-10-CM | POA: Insufficient documentation

## 2011-09-14 DIAGNOSIS — Z8489 Family history of other specified conditions: Secondary | ICD-10-CM | POA: Insufficient documentation

## 2011-09-14 MED ORDER — TRAMADOL HCL 50 MG PO TABS
50.0000 mg | ORAL_TABLET | Freq: Once | ORAL | Status: AC
  Administered 2011-09-14: 50 mg via ORAL
  Filled 2011-09-14: qty 1

## 2011-09-14 MED ORDER — TRAMADOL HCL 50 MG PO TABS: 50.0000 mg | ORAL_TABLET | Freq: Once | ORAL | Status: AC

## 2011-09-14 NOTE — ED Provider Notes (Signed)
History     CSN: [id number]  Arrival date & time 09/14/11  1904   First MD Initiated Contact with Patient 09/14/11 2043      Chief Complaint  Patient presents with  . Ankle Pain    (Consider location/radiation/quality/duration/timing/severity/associated sxs/prior treatment) HPI Comments: Proximal fourth patient had a lateral malleolus fracture of the left ankle.  She did not followup with orthopedics.  She still wearing the posterior splint, and Ace bandage placed in the emergency department.  She has been using crutches for the past 2, days.  She complains that her right foot, and ankle have been painful.  Has not taken any over-the-counter medication.  Prior to arrival.  She denies any fall or mechanism of injury to the right foot/  Patient is a 56 y.o. female presenting with ankle pain. The history is provided by the patient.  Ankle Pain  The incident occurred more than 2 days ago. The incident occurred at home. There was no injury mechanism. The pain is present in the right foot. The pain is at a severity of 5/10. The pain is moderate. The pain has been constant since onset. Pertinent negatives include no numbness, no loss of motion, no muscle weakness and no tingling.    History reviewed. No pertinent past medical history.  History reviewed. No pertinent past surgical history.  Family History  Problem Relation Age of Onset  . Arthritis Other   . Diabetes Other   . Hyperlipidemia Other   . Hypertension Other   . Stroke Other     History  Substance Use Topics  . Smoking status: Current Every Day Smoker  . Smokeless tobacco: Not on file  . Alcohol Use: Yes    OB History    Grav Para Term Preterm Abortions TAB SAB Ect Mult Living                  Review of Systems  Constitutional: Negative for fever.  Musculoskeletal: Positive for gait problem. Negative for joint swelling.  Skin: Negative for wound.  Neurological: Negative for dizziness, tingling, weakness and  numbness.    Allergies  Review of patient's allergies indicates no known allergies.  Home Medications   Current Outpatient Rx  Name Route Sig Dispense Refill  . BIOTIN 1000 MCG PO TABS Oral Take 1,000 mcg by mouth daily.    . OMEGA-3 FATTY ACIDS 1000 MG PO CAPS Oral Take 1 g by mouth daily.    Marland Kitchen HYDROCODONE-ACETAMINOPHEN 5-325 MG PO TABS Oral Take 1 tablet by mouth every 6 (six) hours as needed. For pain    . IBUPROFEN 200 MG PO TABS Oral Take 200-800 mg by mouth every 6 (six) hours as needed. For pain    . ADULT MULTIVITAMIN W/MINERALS CH Oral Take 1 tablet by mouth daily.    . TRAMADOL HCL 50 MG PO TABS Oral Take 1 tablet (50 mg total) by mouth once. 30 tablet 0    BP 136/81  Pulse 74  Temp 99 F (37.2 C) (Oral)  Resp 18  SpO2 100%  Physical Exam  Constitutional: She appears well-developed and well-nourished.  Neck: Normal range of motion.  Cardiovascular: Normal rate.   Musculoskeletal: Normal range of motion.       Feet:  Neurological: She is alert.  Skin: Skin is warm.    ED Course  Procedures (including critical care time)  Labs Reviewed - No data to display No results found.   1. Foot sprain  MDM  Filled the pain in this patient's right foot is from compensating for the left ankle fracture.  I will place her in an ASO provided additional pain support and have patient began attempt to followup with orthopedics         Arman Filter, NP 09/14/11 2114  Arman Filter, NP 09/14/11 2115

## 2011-09-14 NOTE — ED Notes (Signed)
Pt states she noticed swelling to R ankle yesterday evening. L ankle is in a supportive device because "it's broken." Unable to compare ankles to each other to assess for swelling.  Slight redness noted anterior surface of R ankle.

## 2011-09-14 NOTE — Progress Notes (Signed)
Orthopedic Tech Progress Note Patient Details:  Melanie Ewing 17-Dec-1955 [id number]  Ortho Devices Type of Ortho Device: ASO Ortho Device/Splint Location: (R) LE Ortho Device/Splint Interventions: Application   Jennye Moccasin 09/14/2011, 9:19 PM

## 2011-09-14 NOTE — ED Notes (Signed)
The pt has had rt ankle pain for 2-3 days no known injury.

## 2011-09-15 NOTE — ED Provider Notes (Signed)
Medical screening examination/treatment/procedure(s) were performed by non-physician practitioner and as supervising physician I was immediately available for consultation/collaboration.   Delora Fuel, MD 08/88/35 [id number]

## 2012-01-16 ENCOUNTER — Encounter (HOSPITAL_COMMUNITY): Payer: Self-pay | Admitting: *Deleted

## 2012-01-16 ENCOUNTER — Emergency Department (HOSPITAL_COMMUNITY)
Admission: EM | Admit: 2012-01-16 | Discharge: 2012-01-17 | Disposition: A | Discharge status: Home / Self Care | Payer: Self-pay | Attending: Emergency Medicine | Admitting: Emergency Medicine

## 2012-01-16 DIAGNOSIS — L259 Unspecified contact dermatitis, unspecified cause: Secondary | ICD-10-CM | POA: Insufficient documentation

## 2012-01-16 DIAGNOSIS — L299 Pruritus, unspecified: Secondary | ICD-10-CM | POA: Insufficient documentation

## 2012-01-16 DIAGNOSIS — R238 Other skin changes: Secondary | ICD-10-CM | POA: Insufficient documentation

## 2012-01-16 DIAGNOSIS — R109 Unspecified abdominal pain: Secondary | ICD-10-CM | POA: Insufficient documentation

## 2012-01-16 DIAGNOSIS — E119 Type 2 diabetes mellitus without complications: Secondary | ICD-10-CM | POA: Insufficient documentation

## 2012-01-16 DIAGNOSIS — K921 Melena: Secondary | ICD-10-CM | POA: Insufficient documentation

## 2012-01-16 DIAGNOSIS — Z79899 Other long term (current) drug therapy: Secondary | ICD-10-CM | POA: Insufficient documentation

## 2012-01-16 DIAGNOSIS — F17200 Nicotine dependence, unspecified, uncomplicated: Secondary | ICD-10-CM | POA: Insufficient documentation

## 2012-01-16 NOTE — ED Notes (Signed)
Pt reports lower abd pain that hurts worse when getting up and in the morning x2 months - once ambulating pain decreases. Pt also admits to irregular rash occuring on her arms and face. Pt admits to some itching. Pt also c/o HA. Pt denies fever.

## 2012-01-17 LAB — URINALYSIS, ROUTINE W REFLEX MICROSCOPIC
Bilirubin Urine: NEGATIVE
Glucose, UA: NEGATIVE mg/dL
Hgb urine dipstick: NEGATIVE
Specific Gravity, Urine: 1.024 (ref 1.005–1.030)
pH: 5 (ref 5.0–8.0)

## 2012-01-17 LAB — URINE MICROSCOPIC-ADD ON

## 2012-01-17 MED ORDER — HYDROXYZINE HCL 25 MG PO TABS
25.0000 mg | ORAL_TABLET | Freq: Once | ORAL | Status: AC
  Administered 2012-01-17: 25 mg via ORAL
  Filled 2012-01-17: qty 1

## 2012-01-17 MED ORDER — HYDROXYZINE HCL 25 MG PO TABS: 25.0000 mg | ORAL_TABLET | Freq: Four times a day (QID) | ORAL | Status: DC | PRN

## 2012-01-17 MED ORDER — PREDNISONE 20 MG PO TABS: 40.0000 mg | ORAL_TABLET | Freq: Every day | ORAL | Status: DC

## 2012-01-17 MED ORDER — PREDNISONE 20 MG PO TABS
60.0000 mg | ORAL_TABLET | Freq: Once | ORAL | Status: AC
  Administered 2012-01-17: 60 mg via ORAL
  Filled 2012-01-17: qty 3

## 2012-01-17 NOTE — ED Notes (Signed)
NP at bedside.

## 2012-01-17 NOTE — ED Provider Notes (Signed)
History     CSN: [id number]  Arrival date & time 01/16/12  2205   First MD Initiated Contact with Patient 01/17/12 0041      Chief Complaint  Patient presents with  . Rash    (Consider location/radiation/quality/duration/timing/severity/associated sxs/prior treatment) HPI Comments: Pt states that she has developed a diffuse rash in the last two months that erupts as small bumps and itches.  She has tried putting bleach on it which has left dark marks on her skin.  She has also tried "skin lightning cream" on the spots without any help. She also complains of vague abdominal pain throughout that is most noticeable in the mornings when she first gets out of bed. She denies any changes to bowel habits, nausea, vomiting, or urinary changes.  Patient is a 57 y.o. female presenting with rash. The history is provided by the patient.  Rash  This is a new problem. The current episode started more than 1 week ago. The problem has not changed since onset.Associated with: new make up. There has been no fever. The rash is present on the face, left arm and right arm. The patient is experiencing no pain. Associated symptoms include itching. Pertinent negatives include no blisters, no pain and no weeping. The treatment provided mild (Pt has been putting bleach on the affected areas) relief. Risk factors include new environmental exposures.    History reviewed. No pertinent past medical history.  History reviewed. No pertinent past surgical history.  Family History  Problem Relation Age of Onset  . Arthritis Other   . Diabetes Other   . Hyperlipidemia Other   . Hypertension Other   . Stroke Other     History  Substance Use Topics  . Smoking status: Current Every Day Smoker -- 0.5 packs/day    Types: Cigarettes  . Smokeless tobacco: Not on file  . Alcohol Use: Yes     Comment: once a month    OB History    Grav Para Term Preterm Abortions TAB SAB Ect Mult Living                  Review  of Systems  Constitutional: Negative.   HENT: Negative.   Eyes: Negative.   Respiratory: Negative.   Cardiovascular: Negative.   Gastrointestinal: Positive for abdominal pain and blood in stool. Negative for nausea, vomiting, diarrhea, constipation and abdominal distention.  Genitourinary: Negative.   Musculoskeletal: Negative.   Skin: Positive for color change, itching and rash. Negative for wound.       Color change due to the use of bleach on patient's skin.  Neurological: Negative.   Hematological: Negative.   Psychiatric/Behavioral: Negative.   All other systems reviewed and are negative.    Allergies  Review of patient's allergies indicates no known allergies.  Home Medications   Current Outpatient Rx  Name  Route  Sig  Dispense  Refill  . BIOTIN 1000 MCG PO TABS   Oral   Take 1,000 mcg by mouth daily.         . OMEGA-3 FATTY ACIDS 1000 MG PO CAPS   Oral   Take 1 g by mouth daily.         . IBUPROFEN 200 MG PO TABS   Oral   Take 200-800 mg by mouth every 6 (six) hours as needed. For pain         . ADULT MULTIVITAMIN W/MINERALS CH   Oral   Take 1 tablet by mouth daily.         Marland Kitchen  HYDROXYZINE HCL 25 MG PO TABS   Oral   Take 1 tablet (25 mg total) by mouth every 6 (six) hours as needed for itching.   30 tablet   0   . PREDNISONE 20 MG PO TABS   Oral   Take 2 tablets (40 mg total) by mouth daily.   10 tablet   0     BP 100/72  Pulse 72  Temp 98.1 F (36.7 C) (Oral)  Resp 18  SpO2 98%  Physical Exam  Nursing note and vitals reviewed. Constitutional: She is oriented to person, place, and time. She appears well-developed and well-nourished. No distress.  HENT:  Head: Normocephalic and atraumatic.  Eyes: Conjunctivae normal are normal. Pupils are equal, round, and reactive to light.  Neck: Normal range of motion.  Cardiovascular: Normal rate, regular rhythm and normal heart sounds.   Pulmonary/Chest: Effort normal and breath sounds normal. No  stridor.  Abdominal: Soft. Bowel sounds are normal. There is tenderness.       Tender throughout on deep palpation  Musculoskeletal: Normal range of motion.  Neurological: She is alert and oriented to person, place, and time. No cranial nerve deficit.  Skin: Skin is warm and dry. Rash noted. Rash is papular. She is not diaphoretic. No erythema.          Diffuse rash with papules on right and left arms, face, and neck.  Psychiatric: She has a normal mood and affect. Her behavior is normal.    ED Course  Procedures (including critical care time)  Labs Reviewed  URINALYSIS, ROUTINE W REFLEX MICROSCOPIC - Abnormal; Notable for the following:    Leukocytes, UA SMALL (*)     All other components within normal limits  URINE MICROSCOPIC-ADD ON - Abnormal; Notable for the following:    Bacteria, UA FEW (*)     All other components within normal limits  URINE CULTURE   No results found.   1. Contact dermatitis       MDM  Given Rx for Atarax and Prednisone with instructions to FU with dermatology if not getting better Patient is to stop using the body cream.         Arman Filter, NP 01/17/12 [phone]

## 2012-01-18 LAB — URINE CULTURE
Colony Count: NO GROWTH
Culture: NO GROWTH

## 2012-01-18 NOTE — ED Provider Notes (Signed)
Medical screening examination/treatment/procedure(s) were performed by non-physician practitioner and as supervising physician I was immediately available for consultation/collaboration.  Orpah Greek, MD 01/18/12 1539

## 2012-05-20 ENCOUNTER — Encounter: Payer: Self-pay | Admitting: Internal Medicine

## 2012-05-20 ENCOUNTER — Other Ambulatory Visit (INDEPENDENT_AMBULATORY_CARE_PROVIDER_SITE_OTHER): Payer: BC Managed Care – PPO

## 2012-05-20 ENCOUNTER — Ambulatory Visit (INDEPENDENT_AMBULATORY_CARE_PROVIDER_SITE_OTHER): Payer: BC Managed Care – PPO | Admitting: Internal Medicine

## 2012-05-20 VITALS — BP 128/82 | HR 76 | Temp 98.4°F | Resp 16 | Ht 64.0 in | Wt 183.0 lb

## 2012-05-20 DIAGNOSIS — Z0000 Encounter for general adult medical examination without abnormal findings: Secondary | ICD-10-CM

## 2012-05-20 DIAGNOSIS — Z1231 Encounter for screening mammogram for malignant neoplasm of breast: Secondary | ICD-10-CM

## 2012-05-20 DIAGNOSIS — M76899 Other specified enthesopathies of unspecified lower limb, excluding foot: Secondary | ICD-10-CM

## 2012-05-20 DIAGNOSIS — M7062 Trochanteric bursitis, left hip: Secondary | ICD-10-CM | POA: Insufficient documentation

## 2012-05-20 DIAGNOSIS — L820 Inflamed seborrheic keratosis: Secondary | ICD-10-CM

## 2012-05-20 LAB — CBC WITH DIFFERENTIAL/PLATELET
Eosinophils Relative: 4.7 % (ref 0.0–5.0)
MCV: 86 fl (ref 78.0–100.0)
Monocytes Absolute: 0.6 10*3/uL (ref 0.1–1.0)
Neutrophils Relative %: 41.8 % — ABNORMAL LOW (ref 43.0–77.0)
Platelets: 467 10*3/uL — ABNORMAL HIGH (ref 150.0–400.0)
WBC: 9.1 10*3/uL (ref 4.5–10.5)

## 2012-05-20 LAB — COMPREHENSIVE METABOLIC PANEL
AST: 17 U/L (ref 0–37)
Albumin: 3.9 g/dL (ref 3.5–5.2)
Alkaline Phosphatase: 87 U/L (ref 39–117)
Glucose, Bld: 92 mg/dL (ref 70–99)
Potassium: 3.9 mEq/L (ref 3.5–5.1)
Sodium: 141 mEq/L (ref 135–145)
Total Protein: 7.9 g/dL (ref 6.0–8.3)

## 2012-05-20 LAB — LIPID PANEL
HDL: 55.9 mg/dL (ref >39.00)
LDL Cholesterol: 108 mg/dL — ABNORMAL HIGH (ref 0–99)
Total CHOL/HDL Ratio: 3
VLDL: 13.6 mg/dL (ref 0.0–40.0)

## 2012-05-20 LAB — TSH: TSH: 1.57 u[IU]/mL (ref 0.35–5.50)

## 2012-05-20 NOTE — Assessment & Plan Note (Signed)
Derm referral.

## 2012-05-20 NOTE — Assessment & Plan Note (Signed)
Continue nsaids Start PT

## 2012-05-20 NOTE — Progress Notes (Signed)
Subjective:    Patient ID: Melanie Ewing, female    DOB: Jun 01, 1955, 57 y.o.   MRN: [id number]  Arthritis Presents for follow-up visit. She complains of pain. She reports no stiffness, joint swelling or joint warmth. The symptoms have been stable. Affected locations include the left hip. Her pain is at a severity of 2/10. Associated symptoms include pain at night. Pertinent negatives include no diarrhea, dry eyes, dry mouth, dysuria, fatigue, fever, pain while resting, rash, Raynaud's syndrome, uveitis or weight loss. (Intermittent pain for 1-2 years) Compliance with total regimen: she has been taking nsaids and her sister's pain med.      Review of Systems  Constitutional: Negative.  Negative for fever, chills, weight loss, diaphoresis, activity change, appetite change, fatigue and unexpected weight change.  HENT: Negative.   Eyes: Negative.   Respiratory: Negative.  Negative for cough, chest tightness, shortness of breath, wheezing and stridor.   Cardiovascular: Negative.  Negative for chest pain, palpitations and leg swelling.  Gastrointestinal: Negative.  Negative for nausea, vomiting, abdominal pain, diarrhea and constipation.  Endocrine: Negative.   Genitourinary: Negative.  Negative for dysuria.  Musculoskeletal: Positive for arthritis. Negative for back pain, joint swelling, gait problem and stiffness.  Skin: Negative for rash.       She has several moles on her flank that she is concerned about  Allergic/Immunologic: Negative.   Neurological: Negative.  Negative for dizziness.  Hematological: Negative.  Negative for adenopathy. Does not bruise/bleed easily.  Psychiatric/Behavioral: Negative.        Objective:   Physical Exam  Vitals reviewed. Constitutional: She is oriented to person, place, and time. She appears well-developed and well-nourished. No distress.  HENT:  Head: Normocephalic and atraumatic.  Mouth/Throat: Oropharynx is clear and moist. No oropharyngeal  exudate.  Eyes: Conjunctivae are normal. Right eye exhibits no discharge. Left eye exhibits no discharge. No scleral icterus.  Neck: Normal range of motion. Neck supple. No JVD present. No tracheal deviation present. No thyromegaly present.  Cardiovascular: Normal rate, regular rhythm, normal heart sounds and intact distal pulses.  Exam reveals no gallop and no friction rub.   No murmur heard. Pulmonary/Chest: Effort normal and breath sounds normal. No stridor. No respiratory distress. She has no wheezes. She has no rales. Chest wall is not dull to percussion. She exhibits no mass, no tenderness, no bony tenderness, no laceration, no crepitus, no edema, no deformity, no swelling and no retraction. Right breast exhibits no inverted nipple, no mass, no nipple discharge, no skin change and no tenderness. Left breast exhibits no inverted nipple, no mass, no nipple discharge, no skin change and no tenderness. Breasts are symmetrical.  Abdominal: Soft. Bowel sounds are normal. She exhibits no distension and no mass. There is no tenderness. There is no rebound and no guarding.  Musculoskeletal: Normal range of motion. She exhibits no edema and no tenderness.       Left hip: She exhibits tenderness (over the GT). She exhibits normal range of motion, normal strength, no bony tenderness, no swelling, no crepitus, no deformity and no laceration.  Lymphadenopathy:    She has no cervical adenopathy.  Neurological: She is oriented to person, place, and time.  Skin: Skin is warm, dry and intact. Lesion noted. No abrasion, no bruising, no burn, no ecchymosis, no laceration, no petechiae and no rash noted. She is not diaphoretic. No cyanosis or erythema. No pallor. Nails show no clubbing.     Several SK's noted  Psychiatric: She has a normal  mood and affect. Her behavior is normal. Judgment and thought content normal.      Lab Results  Component Value Date   WBC 10.2 06/23/2010   HGB 13.7 06/23/2010   HCT 40.8  06/23/2010   PLT 455.0* 06/23/2010   GLUCOSE 85 06/23/2010   CHOL 170 07/21/2009   TRIG 55.0 07/21/2009   HDL 50.90 07/21/2009   LDLCALC 108* 07/21/2009   ALT 22 06/23/2010   AST 17 06/23/2010   NA 141 06/23/2010   K 4.5 06/23/2010   CL 107 06/23/2010   CREATININE 0.8 06/23/2010   BUN 16 06/23/2010   CO2 30 06/23/2010   TSH 1.13 06/23/2010   HGBA1C 6.2 06/23/2010      Assessment & Plan:

## 2012-05-20 NOTE — Patient Instructions (Signed)
Preventive Care for Adults, Female A healthy lifestyle and preventive care can promote health and wellness. Preventive health guidelines for women include the following key practices.  A routine yearly physical is a good way to check with your caregiver about your health and preventive screening. It is a chance to share any concerns and updates on your health, and to receive a thorough exam.  Visit your dentist for a routine exam and preventive care every 6 months. Brush your teeth twice a day and floss once a day. Good oral hygiene prevents tooth decay and gum disease.  The frequency of eye exams is based on your age, health, family medical history, use of contact lenses, and other factors. Follow your caregiver's recommendations for frequency of eye exams.  Eat a healthy diet. Foods like vegetables, fruits, whole grains, low-fat dairy products, and lean protein foods contain the nutrients you need without too many calories. Decrease your intake of foods high in solid fats, added sugars, and salt. Eat the right amount of calories for you.Get information about a proper diet from your caregiver, if necessary.  Regular physical exercise is one of the most important things you can do for your health. Most adults should get at least 150 minutes of moderate-intensity exercise (any activity that increases your heart rate and causes you to sweat) each week. In addition, most adults need muscle-strengthening exercises on 2 or more days a week.  Maintain a healthy weight. The body mass index (BMI) is a screening tool to identify possible weight problems. It provides an estimate of body fat based on height and weight. Your caregiver can help determine your BMI, and can help you achieve or maintain a healthy weight.For adults 20 years and older:  A BMI below 18.5 is considered underweight.  A BMI of 18.5 to 24.9 is normal.  A BMI of 25 to 29.9 is considered overweight.  A BMI of 30 and above is  considered obese.  Maintain normal blood lipids and cholesterol levels by exercising and minimizing your intake of saturated fat. Eat a balanced diet with plenty of fruit and vegetables. Blood tests for lipids and cholesterol should begin at age 20 and be repeated every 5 years. If your lipid or cholesterol levels are high, you are over 50, or you are at high risk for heart disease, you may need your cholesterol levels checked more frequently.Ongoing high lipid and cholesterol levels should be treated with medicines if diet and exercise are not effective.  If you smoke, find out from your caregiver how to quit. If you do not use tobacco, do not start.  If you are pregnant, do not drink alcohol. If you are breastfeeding, be very cautious about drinking alcohol. If you are not pregnant and choose to drink alcohol, do not exceed 1 drink per day. One drink is considered to be 12 ounces (355 mL) of beer, 5 ounces (148 mL) of wine, or 1.5 ounces (44 mL) of liquor.  Avoid use of street drugs. Do not share needles with anyone. Ask for help if you need support or instructions about stopping the use of drugs.  High blood pressure causes heart disease and increases the risk of stroke. Your blood pressure should be checked at least every 1 to 2 years. Ongoing high blood pressure should be treated with medicines if weight loss and exercise are not effective.  If you are 55 to 55 years old, ask your caregiver if you should take aspirin to prevent strokes.  Diabetes   screening involves taking a blood sample to check your fasting blood sugar level. This should be done once every 3 years, after age 45, if you are within normal weight and without risk factors for diabetes. Testing should be considered at a younger age or be carried out more frequently if you are overweight and have at least 1 risk factor for diabetes.  Breast cancer screening is essential preventive care for women. You should practice "breast  self-awareness." This means understanding the normal appearance and feel of your breasts and may include breast self-examination. Any changes detected, no matter how small, should be reported to a caregiver. Women in their 20s and 30s should have a clinical breast exam (CBE) by a caregiver as part of a regular health exam every 1 to 3 years. After age 40, women should have a CBE every year. Starting at age 40, women should consider having a mammography (breast X-ray test) every year. Women who have a family history of breast cancer should talk to their caregiver about genetic screening. Women at a high risk of breast cancer should talk to their caregivers about having magnetic resonance imaging (MRI) and a mammography every year.  The Pap test is a screening test for cervical cancer. A Pap test can show cell changes on the cervix that might become cervical cancer if left untreated. A Pap test is a procedure in which cells are obtained and examined from the lower end of the uterus (cervix).  Women should have a Pap test starting at age 21.  Between ages 21 and 29, Pap tests should be repeated every 2 years.  Beginning at age 30, you should have a Pap test every 3 years as long as the past 3 Pap tests have been normal.  Some women have medical problems that increase the chance of getting cervical cancer. Talk to your caregiver about these problems. It is especially important to talk to your caregiver if a new problem develops soon after your last Pap test. In these cases, your caregiver may recommend more frequent screening and Pap tests.  The above recommendations are the same for women who have or have not gotten the vaccine for human papillomavirus (HPV).  If you had a hysterectomy for a problem that was not cancer or a condition that could lead to cancer, then you no longer need Pap tests. Even if you no longer need a Pap test, a regular exam is a good idea to make sure no other problems are  starting.  If you are between ages 65 and 70, and you have had normal Pap tests going back 10 years, you no longer need Pap tests. Even if you no longer need a Pap test, a regular exam is a good idea to make sure no other problems are starting.  If you have had past treatment for cervical cancer or a condition that could lead to cancer, you need Pap tests and screening for cancer for at least 20 years after your treatment.  If Pap tests have been discontinued, risk factors (such as a new sexual partner) need to be reassessed to determine if screening should be resumed.  The HPV test is an additional test that may be used for cervical cancer screening. The HPV test looks for the virus that can cause the cell changes on the cervix. The cells collected during the Pap test can be tested for HPV. The HPV test could be used to screen women aged 30 years and older, and should   be used in women of any age who have unclear Pap test results. After the age of 30, women should have HPV testing at the same frequency as a Pap test.  Colorectal cancer can be detected and often prevented. Most routine colorectal cancer screening begins at the age of 50 and continues through age 75. However, your caregiver may recommend screening at an earlier age if you have risk factors for colon cancer. On a yearly basis, your caregiver may provide home test kits to check for hidden blood in the stool. Use of a small camera at the end of a tube, to directly examine the colon (sigmoidoscopy or colonoscopy), can detect the earliest forms of colorectal cancer. Talk to your caregiver about this at age 50, when routine screening begins. Direct examination of the colon should be repeated every 5 to 10 years through age 75, unless early forms of pre-cancerous polyps or small growths are found.  Hepatitis C blood testing is recommended for all people born from 1945 through 1965 and any individual with known risks for hepatitis C.  Practice  safe sex. Use condoms and avoid high-risk sexual practices to reduce the spread of sexually transmitted infections (STIs). STIs include gonorrhea, chlamydia, syphilis, trichomonas, herpes, HPV, and human immunodeficiency virus (HIV). Herpes, HIV, and HPV are viral illnesses that have no cure. They can result in disability, cancer, and death. Sexually active women aged 25 and younger should be checked for chlamydia. Older women with new or multiple partners should also be tested for chlamydia. Testing for other STIs is recommended if you are sexually active and at increased risk.  Osteoporosis is a disease in which the bones lose minerals and strength with aging. This can result in serious bone fractures. The risk of osteoporosis can be identified using a bone density scan. Women ages 65 and over and women at risk for fractures or osteoporosis should discuss screening with their caregivers. Ask your caregiver whether you should take a calcium supplement or vitamin D to reduce the rate of osteoporosis.  Menopause can be associated with physical symptoms and risks. Hormone replacement therapy is available to decrease symptoms and risks. You should talk to your caregiver about whether hormone replacement therapy is right for you.  Use sunscreen with sun protection factor (SPF) of 30 or more. Apply sunscreen liberally and repeatedly throughout the day. You should seek shade when your shadow is shorter than you. Protect yourself by wearing long sleeves, pants, a wide-brimmed hat, and sunglasses year round, whenever you are outdoors.  Once a month, do a whole body skin exam, using a mirror to look at the skin on your back. Notify your caregiver of new moles, moles that have irregular borders, moles that are larger than a pencil eraser, or moles that have changed in shape or color.  Stay current with required immunizations.  Influenza. You need a dose every fall (or winter). The composition of the flu vaccine  changes each year, so being vaccinated once is not enough.  Pneumococcal polysaccharide. You need 1 to 2 doses if you smoke cigarettes or if you have certain chronic medical conditions. You need 1 dose at age 65 (or older) if you have never been vaccinated.  Tetanus, diphtheria, pertussis (Tdap, Td). Get 1 dose of Tdap vaccine if you are younger than age 65, are over 65 and have contact with an infant, are a healthcare worker, are pregnant, or simply want to be protected from whooping cough. After that, you need a Td   booster dose every 10 years. Consult your caregiver if you have not had at least 3 tetanus and diphtheria-containing shots sometime in your life or have a deep or dirty wound.  HPV. You need this vaccine if you are a woman age 26 or younger. The vaccine is given in 3 doses over 6 months.  Measles, mumps, rubella (MMR). You need at least 1 dose of MMR if you were born in 1957 or later. You may also need a second dose.  Meningococcal. If you are age 19 to 21 and a first-year college student living in a residence hall, or have one of several medical conditions, you need to get vaccinated against meningococcal disease. You may also need additional booster doses.  Zoster (shingles). If you are age 60 or older, you should get this vaccine.  Varicella (chickenpox). If you have never had chickenpox or you were vaccinated but received only 1 dose, talk to your caregiver to find out if you need this vaccine.  Hepatitis A. You need this vaccine if you have a specific risk factor for hepatitis A virus infection or you simply wish to be protected from this disease. The vaccine is usually given as 2 doses, 6 to 18 months apart.  Hepatitis B. You need this vaccine if you have a specific risk factor for hepatitis B virus infection or you simply wish to be protected from this disease. The vaccine is given in 3 doses, usually over 6 months. Preventive Services / Frequency Ages 19 to 39  Blood  pressure check.** / Every 1 to 2 years.  Lipid and cholesterol check.** / Every 5 years beginning at age 20.  Clinical breast exam.** / Every 3 years for women in their 20s and 30s.  Pap test.** / Every 2 years from ages 21 through 29. Every 3 years starting at age 30 through age 65 or 70 with a history of 3 consecutive normal Pap tests.  HPV screening.** / Every 3 years from ages 30 through ages 65 to 70 with a history of 3 consecutive normal Pap tests.  Hepatitis C blood test.** / For any individual with known risks for hepatitis C.  Skin self-exam. / Monthly.  Influenza immunization.** / Every year.  Pneumococcal polysaccharide immunization.** / 1 to 2 doses if you smoke cigarettes or if you have certain chronic medical conditions.  Tetanus, diphtheria, pertussis (Tdap, Td) immunization. / A one-time dose of Tdap vaccine. After that, you need a Td booster dose every 10 years.  HPV immunization. / 3 doses over 6 months, if you are 26 and younger.  Measles, mumps, rubella (MMR) immunization. / You need at least 1 dose of MMR if you were born in 1957 or later. You may also need a second dose.  Meningococcal immunization. / 1 dose if you are age 19 to 21 and a first-year college student living in a residence hall, or have one of several medical conditions, you need to get vaccinated against meningococcal disease. You may also need additional booster doses.  Varicella immunization.** / Consult your caregiver.  Hepatitis A immunization.** / Consult your caregiver. 2 doses, 6 to 18 months apart.  Hepatitis B immunization.** / Consult your caregiver. 3 doses usually over 6 months. Ages 40 to 64  Blood pressure check.** / Every 1 to 2 years.  Lipid and cholesterol check.** / Every 5 years beginning at age 20.  Clinical breast exam.** / Every year after age 40.  Mammogram.** / Every year beginning at age 40   and continuing for as long as you are in good health. Consult with your  caregiver.  Pap test.** / Every 3 years starting at age 30 through age 65 or 70 with a history of 3 consecutive normal Pap tests.  HPV screening.** / Every 3 years from ages 30 through ages 65 to 70 with a history of 3 consecutive normal Pap tests.  Fecal occult blood test (FOBT) of stool. / Every year beginning at age 50 and continuing until age 75. You may not need to do this test if you get a colonoscopy every 10 years.  Flexible sigmoidoscopy or colonoscopy.** / Every 5 years for a flexible sigmoidoscopy or every 10 years for a colonoscopy beginning at age 50 and continuing until age 75.  Hepatitis C blood test.** / For all people born from 1945 through 1965 and any individual with known risks for hepatitis C.  Skin self-exam. / Monthly.  Influenza immunization.** / Every year.  Pneumococcal polysaccharide immunization.** / 1 to 2 doses if you smoke cigarettes or if you have certain chronic medical conditions.  Tetanus, diphtheria, pertussis (Tdap, Td) immunization.** / A one-time dose of Tdap vaccine. After that, you need a Td booster dose every 10 years.  Measles, mumps, rubella (MMR) immunization. / You need at least 1 dose of MMR if you were born in 1957 or later. You may also need a second dose.  Varicella immunization.** / Consult your caregiver.  Meningococcal immunization.** / Consult your caregiver.  Hepatitis A immunization.** / Consult your caregiver. 2 doses, 6 to 18 months apart.  Hepatitis B immunization.** / Consult your caregiver. 3 doses, usually over 6 months. Ages 65 and over  Blood pressure check.** / Every 1 to 2 years.  Lipid and cholesterol check.** / Every 5 years beginning at age 20.  Clinical breast exam.** / Every year after age 40.  Mammogram.** / Every year beginning at age 40 and continuing for as long as you are in good health. Consult with your caregiver.  Pap test.** / Every 3 years starting at age 30 through age 65 or 70 with a 3  consecutive normal Pap tests. Testing can be stopped between 65 and 70 with 3 consecutive normal Pap tests and no abnormal Pap or HPV tests in the past 10 years.  HPV screening.** / Every 3 years from ages 30 through ages 65 or 70 with a history of 3 consecutive normal Pap tests. Testing can be stopped between 65 and 70 with 3 consecutive normal Pap tests and no abnormal Pap or HPV tests in the past 10 years.  Fecal occult blood test (FOBT) of stool. / Every year beginning at age 50 and continuing until age 75. You may not need to do this test if you get a colonoscopy every 10 years.  Flexible sigmoidoscopy or colonoscopy.** / Every 5 years for a flexible sigmoidoscopy or every 10 years for a colonoscopy beginning at age 50 and continuing until age 75.  Hepatitis C blood test.** / For all people born from 1945 through 1965 and any individual with known risks for hepatitis C.  Osteoporosis screening.** / A one-time screening for women ages 65 and over and women at risk for fractures or osteoporosis.  Skin self-exam. / Monthly.  Influenza immunization.** / Every year.  Pneumococcal polysaccharide immunization.** / 1 dose at age 65 (or older) if you have never been vaccinated.  Tetanus, diphtheria, pertussis (Tdap, Td) immunization. / A one-time dose of Tdap vaccine if you are over   65 and have contact with an infant, are a healthcare worker, or simply want to be protected from whooping cough. After that, you need a Td booster dose every 10 years.  Varicella immunization.** / Consult your caregiver.  Meningococcal immunization.** / Consult your caregiver.  Hepatitis A immunization.** / Consult your caregiver. 2 doses, 6 to 18 months apart.  Hepatitis B immunization.** / Check with your caregiver. 3 doses, usually over 6 months. ** Family history and personal history of risk and conditions may change your caregiver's recommendations. Document Released: 02/14/2001 Document Revised: 03/13/2011  Document Reviewed: 05/16/2010 [employer] Patient Information 2013 [employer].  

## 2012-05-20 NOTE — Assessment & Plan Note (Signed)
Exam done She was referred for GYN exam and mammogram Vaccines were reviewed Labs ordered Pt ed material was given

## 2012-05-30 ENCOUNTER — Ambulatory Visit (HOSPITAL_COMMUNITY): Payer: BC Managed Care – PPO

## 2012-05-31 ENCOUNTER — Telehealth: Payer: Self-pay | Admitting: *Deleted

## 2012-05-31 DIAGNOSIS — M7062 Trochanteric bursitis, left hip: Secondary | ICD-10-CM

## 2012-05-31 MED ORDER — TRAMADOL HCL 50 MG PO TABS: 50.0000 mg | ORAL_TABLET | Freq: Three times a day (TID) | ORAL | Status: DC | PRN

## 2012-05-31 NOTE — Telephone Encounter (Signed)
done

## 2012-05-31 NOTE — Telephone Encounter (Signed)
Left msg on triage stating saw md last week. Pt states she is in a lot of pain with her side. Requesting md to rx something for pain...Melanie Ewing

## 2012-05-31 NOTE — Telephone Encounter (Signed)
Notified pt with md response.../lmb 

## 2012-06-05 ENCOUNTER — Ambulatory Visit (HOSPITAL_COMMUNITY)
Admission: RE | Admit: 2012-06-05 | Discharge: 2012-06-05 | Disposition: A | Discharge status: Home / Self Care | Payer: BC Managed Care – PPO | Source: Ambulatory Visit | Attending: Internal Medicine | Admitting: Internal Medicine

## 2012-06-05 DIAGNOSIS — Z1231 Encounter for screening mammogram for malignant neoplasm of breast: Secondary | ICD-10-CM

## 2012-06-05 LAB — HM MAMMOGRAPHY

## 2012-06-06 ENCOUNTER — Other Ambulatory Visit: Payer: Self-pay | Admitting: Internal Medicine

## 2012-06-06 DIAGNOSIS — R928 Other abnormal and inconclusive findings on diagnostic imaging of breast: Secondary | ICD-10-CM

## 2012-06-20 ENCOUNTER — Encounter: Payer: BC Managed Care – PPO | Admitting: Obstetrics & Gynecology

## 2012-06-21 ENCOUNTER — Other Ambulatory Visit: Payer: BC Managed Care – PPO

## 2012-08-02 ENCOUNTER — Ambulatory Visit (INDEPENDENT_AMBULATORY_CARE_PROVIDER_SITE_OTHER): Payer: BC Managed Care – PPO | Admitting: Internal Medicine

## 2012-08-02 ENCOUNTER — Encounter: Payer: Self-pay | Admitting: Internal Medicine

## 2012-08-02 ENCOUNTER — Ambulatory Visit (INDEPENDENT_AMBULATORY_CARE_PROVIDER_SITE_OTHER)
Admission: RE | Admit: 2012-08-02 | Discharge: 2012-08-02 | Disposition: A | Discharge status: Home / Self Care | Payer: BC Managed Care – PPO | Source: Ambulatory Visit | Attending: Internal Medicine | Admitting: Internal Medicine

## 2012-08-02 VITALS — BP 118/72 | HR 64 | Temp 97.7°F | Resp 16 | Wt 183.0 lb

## 2012-08-02 DIAGNOSIS — M76899 Other specified enthesopathies of unspecified lower limb, excluding foot: Secondary | ICD-10-CM

## 2012-08-02 DIAGNOSIS — M25572 Pain in left ankle and joints of left foot: Secondary | ICD-10-CM

## 2012-08-02 DIAGNOSIS — M7062 Trochanteric bursitis, left hip: Secondary | ICD-10-CM

## 2012-08-02 DIAGNOSIS — M25579 Pain in unspecified ankle and joints of unspecified foot: Secondary | ICD-10-CM

## 2012-08-02 MED ORDER — DICLOFENAC 18 MG PO CAPS: 1.0000 | ORAL_CAPSULE | Freq: Three times a day (TID) | ORAL | Status: DC | PRN

## 2012-08-02 NOTE — Patient Instructions (Signed)

## 2012-08-02 NOTE — Progress Notes (Signed)
Subjective:    Patient ID: Melanie Ewing, female    DOB: [birth year], 57 y.o.   MRN: [id number]  Arthritis Presents for follow-up visit. She complains of pain. She reports no stiffness, joint swelling or joint warmth. The symptoms have been worsening. Affected locations include the left hip and left ankle. Her pain is at a severity of 2/10. Associated symptoms include pain at night and pain while resting. Pertinent negatives include no diarrhea, dry eyes, dry mouth, dysuria, fatigue, fever, rash, Raynaud's syndrome, uveitis or weight loss. Compliance with total regimen is 26-50%. Compliance problems include psychosocial issues.       Review of Systems  Constitutional: Negative.  Negative for fever, chills, weight loss, diaphoresis and fatigue.  HENT: Negative.   Eyes: Negative.   Respiratory: Negative for cough, chest tightness, shortness of breath and stridor.   Cardiovascular: Negative.  Negative for chest pain, palpitations and leg swelling.  Gastrointestinal: Negative.  Negative for nausea, vomiting, abdominal pain and diarrhea.  Endocrine: Negative.   Genitourinary: Negative.  Negative for dysuria.  Musculoskeletal: Positive for arthritis. Negative for myalgias, back pain, joint swelling, gait problem and stiffness.  Skin: Negative.  Negative for color change, pallor, rash and wound.  Allergic/Immunologic: Negative.   Neurological: Negative.   Hematological: Negative.  Negative for adenopathy. Does not bruise/bleed easily.  Psychiatric/Behavioral: Negative.        Objective:   Physical Exam  Vitals reviewed. Constitutional: She is oriented to person, place, and time. She appears well-developed and well-nourished. No distress.  HENT:  Head: Normocephalic and atraumatic.  Mouth/Throat: Oropharynx is clear and moist. No oropharyngeal exudate.  Eyes: Conjunctivae are normal. Right eye exhibits no discharge. Left eye exhibits no discharge. No scleral icterus.  Neck: Normal  range of motion. Neck supple. No JVD present. No tracheal deviation present. No thyromegaly present.  Cardiovascular: Normal rate, regular rhythm, normal heart sounds and intact distal pulses.  Exam reveals no gallop and no friction rub.   No murmur heard. Pulmonary/Chest: Effort normal and breath sounds normal. No stridor. No respiratory distress. She has no wheezes. She has no rales. She exhibits no tenderness.  Abdominal: Soft. Bowel sounds are normal. She exhibits no distension and no mass. There is no tenderness. There is no rebound and no guarding.  Musculoskeletal: Normal range of motion. She exhibits no edema and no tenderness.       Left hip: Normal. She exhibits normal range of motion, normal strength, no tenderness, no bony tenderness, no swelling, no crepitus, no deformity and no laceration.       Left ankle: She exhibits normal range of motion, no swelling, no ecchymosis, no deformity, no laceration and normal pulse. Tenderness. Lateral malleolus tenderness found. No medial malleolus, no AITFL, no CF ligament, no posterior TFL, no head of 5th metatarsal and no proximal fibula tenderness found. Achilles tendon normal. Achilles tendon exhibits no pain, no defect and normal Thompson's test results.  Lymphadenopathy:    She has no cervical adenopathy.  Neurological: She is oriented to person, place, and time.  Skin: Skin is warm and dry. No rash noted. She is not diaphoretic. No erythema. No pallor.  Psychiatric: She has a normal mood and affect. Her behavior is normal. Judgment and thought content normal.     Lab Results  Component Value Date   WBC 9.1 05/20/2012   HGB 13.1 05/20/2012   HCT 39.3 05/20/2012   PLT 467.0* 05/20/2012   GLUCOSE 92 05/20/2012   CHOL 177 05/20/2012  TRIG 68.0 05/20/2012   HDL 55.90 05/20/2012   LDLCALC 108* 05/20/2012   ALT 18 05/20/2012   AST 17 05/20/2012   NA 141 05/20/2012   K 3.9 05/20/2012   CL 108 05/20/2012   CREATININE 0.7 05/20/2012   BUN 15 05/20/2012    CO2 25 05/20/2012   TSH 1.57 05/20/2012   HGBA1C 6.2 06/23/2010       Assessment & Plan:

## 2012-08-04 ENCOUNTER — Encounter: Payer: Self-pay | Admitting: Internal Medicine

## 2012-08-04 NOTE — Assessment & Plan Note (Signed)
She will try nsaids in addition to the tramadol

## 2012-08-04 NOTE — Assessment & Plan Note (Signed)
Her plain films is normal She describes a sprain 1 year ago that did not heal She tells me that she was not allowed to do PT thru her insurance Will start nsaids and if that does not control the pain then will refer

## 2012-08-12 ENCOUNTER — Other Ambulatory Visit: Payer: Self-pay | Admitting: Internal Medicine

## 2012-08-12 DIAGNOSIS — R928 Other abnormal and inconclusive findings on diagnostic imaging of breast: Secondary | ICD-10-CM

## 2012-12-09 ENCOUNTER — Encounter: Payer: Self-pay | Admitting: Internal Medicine

## 2012-12-09 ENCOUNTER — Other Ambulatory Visit (INDEPENDENT_AMBULATORY_CARE_PROVIDER_SITE_OTHER): Payer: BC Managed Care – PPO

## 2012-12-09 ENCOUNTER — Ambulatory Visit (INDEPENDENT_AMBULATORY_CARE_PROVIDER_SITE_OTHER): Payer: BC Managed Care – PPO | Admitting: Internal Medicine

## 2012-12-09 VITALS — BP 120/88 | HR 76 | Temp 98.8°F | Resp 16 | Ht 64.0 in | Wt 187.0 lb

## 2012-12-09 DIAGNOSIS — K5900 Constipation, unspecified: Secondary | ICD-10-CM

## 2012-12-09 DIAGNOSIS — IMO0002 Reserved for concepts with insufficient information to code with codable children: Secondary | ICD-10-CM

## 2012-12-09 DIAGNOSIS — R928 Other abnormal and inconclusive findings on diagnostic imaging of breast: Secondary | ICD-10-CM

## 2012-12-09 DIAGNOSIS — K5904 Chronic idiopathic constipation: Secondary | ICD-10-CM | POA: Insufficient documentation

## 2012-12-09 DIAGNOSIS — R7309 Other abnormal glucose: Secondary | ICD-10-CM

## 2012-12-09 DIAGNOSIS — M1710 Unilateral primary osteoarthritis, unspecified knee: Secondary | ICD-10-CM

## 2012-12-09 DIAGNOSIS — R7303 Prediabetes: Secondary | ICD-10-CM | POA: Insufficient documentation

## 2012-12-09 LAB — COMPREHENSIVE METABOLIC PANEL
ALT: 20 U/L (ref 0–35)
Alkaline Phosphatase: 78 U/L (ref 39–117)
CO2: 24 mEq/L (ref 19–32)
Creatinine, Ser: 0.7 mg/dL (ref 0.4–1.2)
GFR: 117.43 mL/min (ref >60.00)
Glucose, Bld: 111 mg/dL — ABNORMAL HIGH (ref 70–99)
Total Bilirubin: 0.6 mg/dL (ref 0.3–1.2)

## 2012-12-09 LAB — HEMOGLOBIN A1C: Hgb A1c MFr Bld: 6.1 % (ref 4.6–6.5)

## 2012-12-09 LAB — TSH: TSH: 1.59 u[IU]/mL (ref 0.35–5.50)

## 2012-12-09 MED ORDER — POLYETHYLENE GLYCOL 3350 17 GM/SCOOP PO POWD: 17.0000 g | Freq: Two times a day (BID) | ORAL | Status: DC | PRN

## 2012-12-09 NOTE — Patient Instructions (Signed)
Osteoarthritis Osteoarthritis is the most common form of arthritis. It is redness, soreness, and swelling (inflammation) affecting the cartilage. Cartilage acts as a cushion, covering the ends of bones where they meet to form a joint. CAUSES  Over time, the cartilage begins to wear away. This causes bone to rub on bone. This produces pain and stiffness in the affected joints. Factors that contribute to this problem are:  Excessive body weight.  Age.  Overuse of joints. SYMPTOMS   People with osteoarthritis usually experience joint pain, swelling, or stiffness.  Over time, the joint may lose its normal shape.  Small deposits of bone (osteophytes) may grow on the edges of the joint.  Bits of bone or cartilage can break off and float inside the joint space. This may cause more pain and damage.  Osteoarthritis can lead to depression, anxiety, feelings of helplessness, and limitations on daily activities. The most commonly affected joints are in the:  Ends of the fingers.  Thumbs.  Neck.  Lower back.  Knees.  Hips. DIAGNOSIS  Diagnosis is mostly based on your symptoms and exam. Tests may be helpful, including:  X-rays of the affected joint.  A computerized magnetic scan (MRI).  Blood tests to rule out other types of arthritis.  Joint fluid tests. This involves using a needle to draw fluid from the joint and examining the fluid under a microscope. TREATMENT  Goals of treatment are to control pain, improve joint function, maintain a normal body weight, and maintain a healthy lifestyle. Treatment approaches may include:  A prescribed exercise program with rest and joint relief.  Weight control with nutritional education.  Pain relief techniques such as:  Properly applied heat and cold.  Electric pulses delivered to nerve endings under the skin (transcutaneous electrical nerve stimulation, TENS).  Massage.  Certain supplements. Ask your caregiver before using any  supplements, especially in combination with prescribed drugs.  Medicines to control pain, such as:  Acetaminophen.  Nonsteroidal anti-inflammatory drugs (NSAIDs), such as naproxen.  Narcotic or central-acting agents, such as tramadol. This drug carries a risk of addiction and is generally prescribed for short-term use.  Corticosteroids. These can be given orally or as injection. This is a short-term treatment, not recommended for routine use.  Surgery to reposition the bones and relieve pain (osteotomy) or to remove loose pieces of bone and cartilage. Joint replacement may be needed in advanced states of osteoarthritis. HOME CARE INSTRUCTIONS  Your caregiver can recommend specific types of exercise. These may include:  Strengthening exercises. These are done to strengthen the muscles that support joints affected by arthritis. They can be performed with weights or with exercise bands to add resistance.  Aerobic activities. These are exercises, such as brisk walking or low-impact aerobics, that get your heart pumping. They can help keep your lungs and circulatory system in shape.  Range-of-motion activities. These keep your joints limber.  Balance and agility exercises. These help you maintain daily living skills. Learning about your condition and being actively involved in your care will help improve the course of your osteoarthritis. SEEK MEDICAL CARE IF:   You feel hot or your skin turns red.  You develop a rash in addition to your joint pain.  You have an oral temperature above 102 F (38.9 C). FOR MORE INFORMATION  National Institute of Arthritis and Musculoskeletal and Skin Diseases: www.niams.nih.gov National Institute on Aging: www.nia.nih.gov American College of Rheumatology: www.rheumatology.org Document Released: 12/19/2004 Document Revised: 03/13/2011 Document Reviewed: 04/01/2009 ExitCare Patient Information 2014 [employer].  

## 2012-12-09 NOTE — Progress Notes (Signed)
Subjective:    Patient ID: Melanie Ewing, female    DOB: 19-May-1955, 57 y.o.   MRN: [id number]  Constipation This is a chronic problem. The current episode started more than 1 year ago. The problem is unchanged. Her stool frequency is 2 to 3 times per week. The stool is described as formed and firm. The patient is on a high fiber diet. She exercises regularly. There has been adequate water intake. Pertinent negatives include no abdominal pain, anorexia, back pain, bloating, diarrhea, difficulty urinating, fecal incontinence, fever, flatus, hematochezia, hemorrhoids, melena, nausea, rectal pain, vomiting or weight loss. Risk factors include change in medication usage/dosage. She has tried diet changes for the symptoms. The treatment provided mild relief.      Review of Systems  Constitutional: Negative.  Negative for fever, chills, weight loss, diaphoresis, appetite change and fatigue.  HENT: Negative.   Eyes: Negative.   Respiratory: Negative.  Negative for cough, choking, chest tightness, shortness of breath, wheezing and stridor.   Cardiovascular: Negative.  Negative for chest pain, palpitations and leg swelling.  Gastrointestinal: Positive for constipation. Negative for nausea, vomiting, abdominal pain, diarrhea, blood in stool, melena, hematochezia, abdominal distention, anal bleeding, rectal pain, bloating, anorexia, flatus and hemorrhoids.  Endocrine: Negative.   Genitourinary: Negative.  Negative for difficulty urinating.  Musculoskeletal: Positive for arthralgias (both knees). Negative for back pain, gait problem, joint swelling, myalgias, neck pain and neck stiffness.  Skin: Negative.   Allergic/Immunologic: Negative.   Neurological: Negative.  Negative for dizziness, tremors, syncope, speech difficulty, weakness, light-headedness, numbness and headaches.  Hematological: Negative.  Negative for adenopathy. Does not bruise/bleed easily.  Psychiatric/Behavioral: Negative.         Objective:   Physical Exam  Vitals reviewed. Constitutional: She is oriented to person, place, and time. She appears well-developed and well-nourished. No distress.  HENT:  Head: Normocephalic and atraumatic.  Mouth/Throat: Oropharynx is clear and moist. No oropharyngeal exudate.  Eyes: Conjunctivae are normal. Right eye exhibits no discharge. Left eye exhibits no discharge. No scleral icterus.  Neck: Normal range of motion. Neck supple. No JVD present. No tracheal deviation present. No thyromegaly present.  Cardiovascular: Normal rate, regular rhythm, normal heart sounds and intact distal pulses.  Exam reveals no gallop and no friction rub.   No murmur heard. Pulmonary/Chest: Effort normal and breath sounds normal. No stridor. No respiratory distress. She has no wheezes. She has no rales. She exhibits no tenderness.  Abdominal: Soft. Bowel sounds are normal. She exhibits no distension and no mass. There is no tenderness. There is no rebound and no guarding.  Musculoskeletal: Normal range of motion. She exhibits no edema and no tenderness.  Lymphadenopathy:    She has no cervical adenopathy.  Neurological: She is oriented to person, place, and time.  Skin: Skin is warm and dry. No rash noted. She is not diaphoretic. No erythema. No pallor.  Psychiatric: She has a normal mood and affect. Her behavior is normal. Judgment and thought content normal.     Lab Results  Component Value Date   WBC 9.1 05/20/2012   HGB 13.1 05/20/2012   HCT 39.3 05/20/2012   PLT 467.0* 05/20/2012   GLUCOSE 92 05/20/2012   CHOL 177 05/20/2012   TRIG 68.0 05/20/2012   HDL 55.90 05/20/2012   LDLCALC 108* 05/20/2012   ALT 18 05/20/2012   AST 17 05/20/2012   NA 141 05/20/2012   K 3.9 05/20/2012   CL 108 05/20/2012   CREATININE 0.7 05/20/2012  BUN 15 05/20/2012   CO2 25 05/20/2012   TSH 1.57 05/20/2012   HGBA1C 6.2 06/23/2010       Assessment & Plan:

## 2012-12-09 NOTE — Progress Notes (Signed)
Pre visit review using our clinic review tool, if applicable. No additional management support is needed unless otherwise documented below in the visit note. 

## 2012-12-11 ENCOUNTER — Telehealth: Payer: Self-pay | Admitting: *Deleted

## 2012-12-11 DIAGNOSIS — M7062 Trochanteric bursitis, left hip: Secondary | ICD-10-CM

## 2012-12-11 MED ORDER — TRAMADOL HCL 50 MG PO TABS: 50.0000 mg | ORAL_TABLET | Freq: Three times a day (TID) | ORAL | Status: DC | PRN

## 2012-12-11 NOTE — Telephone Encounter (Signed)
Called the patient informed sent hardcopy to [facility].

## 2012-12-11 NOTE — Telephone Encounter (Signed)
Pt called states the Diclofenac is not effective.  Pt is requesting something more effective, states she had to leave work today due to pain.  Please advise in Dr Yetta Barre absence.

## 2012-12-11 NOTE — Assessment & Plan Note (Signed)
I will check her A1C to see if she has developed DM2 

## 2012-12-11 NOTE — Assessment & Plan Note (Signed)
Her labs show no evidence of a metabolic cause for this I have asked her to try miralax

## 2012-12-11 NOTE — Assessment & Plan Note (Signed)
It appears that she did not have the breast u/s done that was previously ordered I have ordered it again

## 2012-12-11 NOTE — Telephone Encounter (Signed)
Ok for tramadol prn as was rx earlier 2014 -Done hardcopy to [facility]

## 2012-12-11 NOTE — Assessment & Plan Note (Signed)
She will continue zorvolex as needed for pain

## 2013-02-10 ENCOUNTER — Encounter: Payer: Self-pay | Admitting: Internal Medicine

## 2013-02-10 ENCOUNTER — Ambulatory Visit (INDEPENDENT_AMBULATORY_CARE_PROVIDER_SITE_OTHER): Payer: BC Managed Care – PPO | Admitting: Internal Medicine

## 2013-02-10 ENCOUNTER — Ambulatory Visit (INDEPENDENT_AMBULATORY_CARE_PROVIDER_SITE_OTHER)
Admission: RE | Admit: 2013-02-10 | Discharge: 2013-02-10 | Disposition: A | Discharge status: Home / Self Care | Payer: BC Managed Care – PPO | Source: Ambulatory Visit | Attending: Internal Medicine | Admitting: Internal Medicine

## 2013-02-10 VITALS — BP 120/82 | HR 71 | Temp 98.4°F | Resp 16 | Ht 64.0 in | Wt 185.0 lb

## 2013-02-10 DIAGNOSIS — M5412 Radiculopathy, cervical region: Secondary | ICD-10-CM

## 2013-02-10 DIAGNOSIS — R928 Other abnormal and inconclusive findings on diagnostic imaging of breast: Secondary | ICD-10-CM

## 2013-02-10 DIAGNOSIS — M25572 Pain in left ankle and joints of left foot: Secondary | ICD-10-CM

## 2013-02-10 DIAGNOSIS — M542 Cervicalgia: Secondary | ICD-10-CM

## 2013-02-10 DIAGNOSIS — M7062 Trochanteric bursitis, left hip: Secondary | ICD-10-CM

## 2013-02-10 MED ORDER — DICLOFENAC 18 MG PO CAPS: 1.0000 | ORAL_CAPSULE | Freq: Three times a day (TID) | ORAL | Status: DC | PRN

## 2013-02-10 MED ORDER — HYDROCODONE-ACETAMINOPHEN 10-325 MG PO TABS: 1.0000 | ORAL_TABLET | Freq: Three times a day (TID) | ORAL | Status: DC | PRN

## 2013-02-10 NOTE — Progress Notes (Signed)
Pre visit review using our clinic review tool, if applicable. No additional management support is needed unless otherwise documented below in the visit note. 

## 2013-02-10 NOTE — Progress Notes (Signed)
Subjective:    Patient ID: Melanie Ewing, female    DOB: [birth year], 58 y.o.   MRN: [id number]  Neck Pain  This is a new problem. The current episode started in the past 7 days. The problem occurs intermittently. The problem has been unchanged. The pain is associated with nothing. The pain is present in the right side. The quality of the pain is described as aching, shooting and stabbing. The pain is at a severity of 3/10. The pain is moderate. Nothing aggravates the symptoms. The pain is worse during the day. Stiffness is present all day. Associated symptoms include numbness (right hand) and tingling (right hand). Pertinent negatives include no chest pain, fever, headaches, leg pain, pain with swallowing, paresis, photophobia, syncope, trouble swallowing, visual change, weakness or weight loss. She has tried NSAIDs for the symptoms. The treatment provided mild relief.      Review of Systems  Constitutional: Negative for fever, chills, weight loss, diaphoresis, appetite change and fatigue.  HENT: Negative.  Negative for sinus pressure, sore throat and trouble swallowing.   Eyes: Negative.  Negative for photophobia.  Respiratory: Negative.  Negative for cough, choking, chest tightness, shortness of breath, wheezing and stridor.   Cardiovascular: Negative.  Negative for chest pain, palpitations, leg swelling and syncope.  Gastrointestinal: Negative.  Negative for nausea, abdominal pain, diarrhea and constipation.  Endocrine: Negative.   Genitourinary: Negative.   Musculoskeletal: Positive for neck pain. Negative for arthralgias, back pain, gait problem, joint swelling, myalgias and neck stiffness.  Skin: Negative.   Allergic/Immunologic: Negative.   Neurological: Positive for tingling (right hand) and numbness (right hand). Negative for weakness and headaches.  Hematological: Negative.   Psychiatric/Behavioral: Negative.        Objective:   Physical Exam  Vitals  reviewed. Constitutional: She is oriented to person, place, and time. She appears well-developed and well-nourished. No distress.  HENT:  Head: Normocephalic and atraumatic.  Mouth/Throat: Oropharynx is clear and moist. No oropharyngeal exudate.  Eyes: Conjunctivae are normal. Right eye exhibits no discharge. Left eye exhibits no discharge. No scleral icterus.  Neck: Normal range of motion. Neck supple. No JVD present. No tracheal deviation present. No thyromegaly present.  Cardiovascular: Normal rate, regular rhythm, normal heart sounds and intact distal pulses.  Exam reveals no gallop and no friction rub.   No murmur heard. Pulmonary/Chest: Effort normal and breath sounds normal. No stridor. No respiratory distress. She has no wheezes. She has no rales. She exhibits no tenderness.  Abdominal: Soft. Bowel sounds are normal. She exhibits no distension and no mass. There is no tenderness. There is no rebound and no guarding.  Musculoskeletal: Normal range of motion. She exhibits no edema and no tenderness.  Lymphadenopathy:    She has no cervical adenopathy.  Neurological: She is alert and oriented to person, place, and time. She has normal strength. She displays no atrophy, no tremor and normal reflexes. No cranial nerve deficit or sensory deficit. She exhibits normal muscle tone. She displays a negative Romberg sign. She displays no seizure activity. Coordination and gait normal.  Reflex Scores:      Tricep reflexes are 1+ on the right side and 1+ on the left side.      Bicep reflexes are 1+ on the right side and 1+ on the left side.      Brachioradialis reflexes are 1+ on the right side and 1+ on the left side.      Patellar reflexes are 1+ on the right side  and 1+ on the left side.      Achilles reflexes are 1+ on the right side and 1+ on the left side. Skin: Skin is warm and dry. No rash noted. She is not diaphoretic. No erythema. No pallor.     Lab Results  Component Value Date   WBC  9.1 05/20/2012   HGB 13.1 05/20/2012   HCT 39.3 05/20/2012   PLT 467.0* 05/20/2012   GLUCOSE 111* 12/09/2012   CHOL 177 05/20/2012   TRIG 68.0 05/20/2012   HDL 55.90 05/20/2012   LDLCALC 108* 05/20/2012   ALT 20 12/09/2012   AST 17 12/09/2012   NA 139 12/09/2012   K 3.9 12/09/2012   CL 108 12/09/2012   CREATININE 0.7 12/09/2012   BUN 16 12/09/2012   CO2 24 12/09/2012   TSH 1.59 12/09/2012   HGBA1C 6.1 12/09/2012       Assessment & Plan:

## 2013-02-10 NOTE — Patient Instructions (Signed)
Torticollis, Acute °You have suddenly (acutely) developed a twisted neck (torticollis). This is usually a self-limited condition. °CAUSES  °Acute torticollis may be caused by malposition, trauma or infection. Most commonly, acute torticollis is caused by sleeping in an awkward position. Torticollis may also be caused by the flexion, extension or twisting of the neck muscles beyond their normal position. Sometimes, the exact cause may not be known. °SYMPTOMS  °Usually, there is pain and limited movement of the neck. Your neck may twist to one side. °DIAGNOSIS  °The diagnosis is often made by physical examination. X-rays, CT scans or MRIs may be done if there is a history of trauma or concern of infection. °TREATMENT  °For a common, stiff neck that develops during sleep, treatment is focused on relaxing the contracted neck muscle. Medications (including shots) may be used to treat the problem. Most cases resolve in several days. Torticollis usually responds to conservative physical therapy. If left untreated, the shortened and spastic neck muscle can cause deformities in the face and neck. Rarely, surgery is required. °HOME CARE INSTRUCTIONS  °· Use over-the-counter and prescription medications as directed by your caregiver. °· Do stretching exercises and massage the neck as directed by your caregiver. °· Follow up with physical therapy if needed and as directed by your caregiver. °SEEK IMMEDIATE MEDICAL CARE IF:  °· You develop difficulty breathing or noisy breathing (stridor). °· You drool, develop trouble swallowing or have pain with swallowing. °· You develop numbness or weakness in the hands or feet. °· You have changes in speech or vision. °· You have problems with urination or bowel movements. °· You have difficulty walking. °· You have a fever. °· You have increased pain. °MAKE SURE YOU:  °· Understand these instructions. °· Will watch your condition. °· Will get help right away if you are not doing well or  get worse. °Document Released: 12/17/1999 Document Revised: 03/13/2011 Document Reviewed: 01/27/2009 °ExitCare® Patient Information ©2014 [employer]. ° °

## 2013-02-11 NOTE — Assessment & Plan Note (Signed)
Her plain xray shows significant disease Will treat and if she does not improve much will try steroids and get an MRI done to see if there is nerve impingement or spinal stenosis

## 2013-02-11 NOTE — Assessment & Plan Note (Signed)
Plain films show a lot of disease  Will treat the pain with zorvolex and norco

## 2013-02-19 ENCOUNTER — Other Ambulatory Visit: Payer: BC Managed Care – PPO

## 2013-03-03 ENCOUNTER — Other Ambulatory Visit: Payer: BC Managed Care – PPO

## 2013-03-05 ENCOUNTER — Encounter: Payer: Self-pay | Admitting: Internal Medicine

## 2013-03-05 ENCOUNTER — Ambulatory Visit (INDEPENDENT_AMBULATORY_CARE_PROVIDER_SITE_OTHER): Payer: BC Managed Care – PPO | Admitting: Internal Medicine

## 2013-03-05 VITALS — BP 110/64 | HR 74 | Temp 98.3°F | Resp 16 | Ht 64.0 in | Wt 184.0 lb

## 2013-03-05 DIAGNOSIS — M542 Cervicalgia: Secondary | ICD-10-CM

## 2013-03-05 DIAGNOSIS — M5412 Radiculopathy, cervical region: Secondary | ICD-10-CM

## 2013-03-05 MED ORDER — HYDROCODONE-ACETAMINOPHEN 10-325 MG PO TABS: 1.0000 | ORAL_TABLET | Freq: Three times a day (TID) | ORAL | Status: DC | PRN

## 2013-03-05 NOTE — Assessment & Plan Note (Signed)
Will cont the same meds for pain I have ordered an MRI to see how severe the foraminal narrowing is She will also see pain management for further evaluation

## 2013-03-05 NOTE — Progress Notes (Signed)
Subjective:    Patient ID: Melanie Ewing, female    DOB: January 25, 1955, 58 y.o.   MRN: [id number]  Neck Pain  This is a recurrent problem. The current episode started more than 1 month ago. The problem has been gradually worsening. The pain is associated with nothing. The pain is present in the right side. The quality of the pain is described as shooting. The pain is at a severity of 4/10. The pain is moderate. Nothing aggravates the symptoms. The pain is worse during the day. Associated symptoms include numbness (right hand and arm) and tingling (right hand and arm). Pertinent negatives include no chest pain, headaches, leg pain, pain with swallowing, paresis, photophobia, syncope, trouble swallowing, visual change, weakness or weight loss. She has tried NSAIDs and oral narcotics for the symptoms. The treatment provided moderate relief.      Review of Systems  Constitutional: Negative.  Negative for weight loss.  HENT: Negative.  Negative for trouble swallowing.   Eyes: Negative.  Negative for photophobia.  Respiratory: Negative.  Negative for cough, choking, chest tightness, shortness of breath and stridor.   Cardiovascular: Negative.  Negative for chest pain, palpitations, leg swelling and syncope.  Gastrointestinal: Negative.  Negative for nausea, vomiting, abdominal pain, diarrhea and constipation.  Endocrine: Negative.   Genitourinary: Negative.   Musculoskeletal: Positive for neck pain. Negative for arthralgias, back pain, gait problem, joint swelling, myalgias and neck stiffness.  Allergic/Immunologic: Negative.   Neurological: Positive for tingling (right hand and arm) and numbness (right hand and arm). Negative for dizziness, weakness and headaches.  Hematological: Negative.  Negative for adenopathy. Does not bruise/bleed easily.  Psychiatric/Behavioral: Negative.        Objective:   Physical Exam  Vitals reviewed. Constitutional: She is oriented to person, place, and  time. She appears well-developed and well-nourished. No distress.  HENT:  Head: Normocephalic and atraumatic.  Mouth/Throat: Oropharynx is clear and moist. No oropharyngeal exudate.  Eyes: Conjunctivae are normal. Right eye exhibits no discharge. Left eye exhibits no discharge. No scleral icterus.  Neck: Normal range of motion. Neck supple. No JVD present. No tracheal deviation present. No thyromegaly present.  Cardiovascular: Normal rate, regular rhythm, normal heart sounds and intact distal pulses.  Exam reveals no gallop and no friction rub.   No murmur heard. Pulmonary/Chest: Effort normal and breath sounds normal. No stridor. No respiratory distress. She has no wheezes. She has no rales. She exhibits no tenderness.  Abdominal: Soft. Bowel sounds are normal. She exhibits no distension and no mass. There is no tenderness. There is no rebound and no guarding.  Musculoskeletal: Normal range of motion. She exhibits no edema and no tenderness.       Cervical back: Normal. She exhibits normal range of motion, no tenderness, no bony tenderness, no swelling, no edema, no deformity, no laceration, no pain, no spasm and normal pulse.  Lymphadenopathy:    She has no cervical adenopathy.  Neurological: She is alert and oriented to person, place, and time. She has normal strength. She displays no atrophy, no tremor and normal reflexes. No cranial nerve deficit or sensory deficit. She exhibits normal muscle tone. She displays a negative Romberg sign. She displays no seizure activity. Coordination and gait normal. She displays no Babinski's sign on the right side. She displays no Babinski's sign on the left side.  Reflex Scores:      Tricep reflexes are 1+ on the right side and 1+ on the left side.  Bicep reflexes are 1+ on the right side and 1+ on the left side.      Brachioradialis reflexes are 1+ on the right side and 1+ on the left side.      Patellar reflexes are 1+ on the right side and 1+ on the  left side.      Achilles reflexes are 1+ on the right side and 1+ on the left side. Skin: Skin is warm and dry. No rash noted. She is not diaphoretic. No erythema. No pallor.     Lab Results  Component Value Date   WBC 9.1 05/20/2012   HGB 13.1 05/20/2012   HCT 39.3 05/20/2012   PLT 467.0* 05/20/2012   GLUCOSE 111* 12/09/2012   CHOL 177 05/20/2012   TRIG 68.0 05/20/2012   HDL 55.90 05/20/2012   LDLCALC 108* 05/20/2012   ALT 20 12/09/2012   AST 17 12/09/2012   NA 139 12/09/2012   K 3.9 12/09/2012   CL 108 12/09/2012   CREATININE 0.7 12/09/2012   BUN 16 12/09/2012   CO2 24 12/09/2012   TSH 1.59 12/09/2012   HGBA1C 6.1 12/09/2012       Assessment & Plan:

## 2013-03-05 NOTE — Patient Instructions (Signed)
Spinal Stenosis  Spinal stenosis is an abnormal narrowing of the canals of your spine (vertebrae).  CAUSES   Spinal stenosis is caused by areas of bone pushing into the central canals of your vertebrae. This condition can be present at birth (congenital). It also may be caused by arthritic deterioration of your vertebrae (spinal degeneration).   SYMPTOMS   · Pain that is generally worse with activities, particularly standing and walking.  · Numbness, tingling, hot or cold sensations, weakness, or weariness in your legs.  · Frequent episodes of falling.  · A foot-slapping gait that leads to muscle weakness.  DIAGNOSIS   Spinal stenosis is diagnosed with the use of magnetic resonance imaging (MRI) or computed tomography (CT).  TREATMENT   Initial therapy for spinal stenosis focuses on the management of the pain and other symptoms associated with the condition. These therapies include:  · Practicing postural changes to lessen pressure on your nerves.  · Exercises to strengthen the core of your body.  · Loss of excess body weight.  · The use of nonsteroidal anti-inflammatory medications to reduce swelling and inflammation in your nerves.  When therapies to manage pain are not successful, surgery to treat spinal stenosis may be recommended. This surgery involves removing excess bone, which puts pressure on your nerve roots. During this surgery (laminectomy), the posterior boney arch (lamina) and excess bone around the facet joints are removed.  Document Released: 03/11/2003 Document Revised: 04/15/2012 Document Reviewed: 03/29/2012  ExitCare® Patient Information ©2014 [employer].

## 2013-03-10 ENCOUNTER — Ambulatory Visit
Admission: RE | Admit: 2013-03-10 | Discharge: 2013-03-10 | Disposition: A | Discharge status: Home / Self Care | Payer: BC Managed Care – PPO | Source: Ambulatory Visit | Attending: Internal Medicine | Admitting: Internal Medicine

## 2013-03-10 ENCOUNTER — Encounter: Payer: Self-pay | Admitting: Internal Medicine

## 2013-03-10 DIAGNOSIS — M542 Cervicalgia: Secondary | ICD-10-CM

## 2013-03-10 DIAGNOSIS — M5412 Radiculopathy, cervical region: Secondary | ICD-10-CM

## 2013-03-12 ENCOUNTER — Other Ambulatory Visit: Payer: BC Managed Care – PPO

## 2013-03-17 ENCOUNTER — Telehealth: Payer: Self-pay | Admitting: *Deleted

## 2013-03-17 NOTE — Telephone Encounter (Signed)
Pt called requesting clarification of MRI results.  Spoke with pt clarified results with her.  Also informed her that a referral to pain management has been placed and she will be contacted.

## 2013-03-21 ENCOUNTER — Ambulatory Visit
Admission: RE | Admit: 2013-03-21 | Discharge: 2013-03-21 | Disposition: A | Discharge status: Home / Self Care | Payer: Self-pay | Source: Ambulatory Visit | Attending: Internal Medicine | Admitting: Internal Medicine

## 2013-03-21 DIAGNOSIS — R928 Other abnormal and inconclusive findings on diagnostic imaging of breast: Secondary | ICD-10-CM

## 2013-03-21 LAB — HM MAMMOGRAPHY: HM MAMMO: NORMAL

## 2013-03-25 ENCOUNTER — Encounter: Payer: Self-pay | Admitting: Physical Medicine & Rehabilitation

## 2013-04-02 ENCOUNTER — Telehealth: Payer: Self-pay

## 2013-04-02 DIAGNOSIS — M5412 Radiculopathy, cervical region: Secondary | ICD-10-CM

## 2013-04-02 DIAGNOSIS — M542 Cervicalgia: Secondary | ICD-10-CM

## 2013-04-02 MED ORDER — HYDROCODONE-ACETAMINOPHEN 10-325 MG PO TABS: 1.0000 | ORAL_TABLET | Freq: Three times a day (TID) | ORAL | Status: DC | PRN

## 2013-04-02 NOTE — Telephone Encounter (Signed)
The patient is hoping to get a pain medication called in for her pinched nerve   Callback - [phone]

## 2013-04-02 NOTE — Telephone Encounter (Signed)
Pt notified to pick up

## 2013-04-02 NOTE — Telephone Encounter (Signed)
done

## 2013-04-03 ENCOUNTER — Telehealth: Payer: Self-pay | Admitting: Internal Medicine

## 2013-04-03 NOTE — Telephone Encounter (Signed)
She just wants info as to what she could expect

## 2013-04-03 NOTE — Telephone Encounter (Signed)
Patient stated she spoke with dr. Ronnald Ramp about a surgery.  She is requesting information be mailed to her.

## 2013-05-02 ENCOUNTER — Other Ambulatory Visit: Payer: Self-pay | Admitting: Physical Medicine & Rehabilitation

## 2013-05-02 ENCOUNTER — Ambulatory Visit (HOSPITAL_BASED_OUTPATIENT_CLINIC_OR_DEPARTMENT_OTHER): Payer: BC Managed Care – PPO | Admitting: Physical Medicine & Rehabilitation

## 2013-05-02 ENCOUNTER — Encounter: Payer: Self-pay | Admitting: Physical Medicine & Rehabilitation

## 2013-05-02 ENCOUNTER — Encounter: Payer: BC Managed Care – PPO | Attending: Physical Medicine & Rehabilitation

## 2013-05-02 VITALS — BP 149/72 | HR 52 | Resp 14 | Ht 64.0 in | Wt 180.4 lb

## 2013-05-02 DIAGNOSIS — Z87891 Personal history of nicotine dependence: Secondary | ICD-10-CM | POA: Insufficient documentation

## 2013-05-02 DIAGNOSIS — M542 Cervicalgia: Secondary | ICD-10-CM

## 2013-05-02 DIAGNOSIS — Z79899 Other long term (current) drug therapy: Secondary | ICD-10-CM

## 2013-05-02 DIAGNOSIS — Z5181 Encounter for therapeutic drug level monitoring: Secondary | ICD-10-CM

## 2013-05-02 DIAGNOSIS — M5412 Radiculopathy, cervical region: Secondary | ICD-10-CM | POA: Insufficient documentation

## 2013-05-02 DIAGNOSIS — M47812 Spondylosis without myelopathy or radiculopathy, cervical region: Secondary | ICD-10-CM | POA: Insufficient documentation

## 2013-05-02 DIAGNOSIS — M4802 Spinal stenosis, cervical region: Secondary | ICD-10-CM

## 2013-05-02 MED ORDER — METHYLPREDNISOLONE 4 MG PO KIT: PACK | ORAL | Status: DC

## 2013-05-02 MED ORDER — GABAPENTIN 300 MG PO CAPS: 300.0000 mg | ORAL_CAPSULE | Freq: Every day | ORAL | Status: DC

## 2013-05-02 MED ORDER — TRAMADOL HCL 50 MG PO TABS: 50.0000 mg | ORAL_TABLET | Freq: Four times a day (QID) | ORAL | Status: DC | PRN

## 2013-05-02 NOTE — Patient Instructions (Signed)
Therapy will call you for appt

## 2013-05-02 NOTE — Progress Notes (Signed)
Subjective:    Patient ID: Melanie Ewing, female    DOB: 14-Jul-1955, 58 y.o.   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 Tried hydrocodone pills at night, which are helpful. She tried them during the day but they made her excessively drowsy. Has run out of hydrocodone tablets. 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 General activity 2 Relation with others 5 Enjoyment of life 5 What TIME of day is your pain at its worst? all Sleep (in general) Poor  Pain is worse with: unsure Pain improves with: heat/ice and medication Relief from Meds: 9  Mobility walk without assistance ability to climb steps?  yes do you drive?  yes  Function employed # of hrs/week 40 what is your job? [employer] Do you have any goals in this area?  yes  Neuro/Psych numbness tingling  Prior Studies Any changes since last visit?  no 03/10/2013 C Spine MRI ordered by Alona Bene MD IMPRESSION:  1. Cervical spondylosis as described above most severe at C5-6 and  C6-7. At C5-6 there is a moderate broad-based disc bulge flattening  the ventral thecal sac and mildly deforming the cervical spinal  cord. At C5-6 there is bilateral uncovertebral degenerative change,  right greater than left, resulting in severe bilateral foraminal  stenosis.  Physicians involved in your care Any changes since last visit?  no   Family History  Problem Relation Age of Onset  . Arthritis Other   . Diabetes Other   .  Hyperlipidemia Other   . Hypertension Other   . Stroke Other   . Alcohol abuse Mother    History   Social History  . Marital Status: Single    Spouse Name: N/A    Number of Children: N/A  . Years of Education: N/A   Occupational History  . CNA     [employer]   Social History Main Topics  . Smoking status: Former Smoker -- 0.50 packs/day    Types: Cigarettes    Quit date: 03/09/2012  . Smokeless tobacco: Never Used  . Alcohol Use: No     Comment: once a month  . Drug Use: No  . Sexual Activity: Not Currently   Other Topics Concern  . None   Social History Narrative  . None   History reviewed. No pertinent past surgical history. Past Medical History  Diagnosis Date  . Arthritis    BP 149/72  Pulse 52  Resp 14  Ht 5' 4"  (1.626 m)  Wt 180 lb 6.4 oz (81.829 kg)  BMI 30.95 kg/m2  SpO2 98%  Opioid Risk Score: 1 Fall Risk Score: Low Fall Risk (0-5 points) (educated and handout given for fall prevention in the home)   Review of Systems  Musculoskeletal: Positive for neck pain.  Neurological: Positive for numbness.       Tingling  All other systems reviewed and are negative.      Objective:   Physical Exam  Constitutional: She  is oriented to person, place, and time.  Musculoskeletal:  Scar palmar wrist Left , PIP and DIP contracture  Neurological: She is alert and oriented to person, place, and time. She has normal strength. No sensory deficit. Gait normal.  Reflex Scores:      Tricep reflexes are 0 on the right side and 0 on the left side.      Bicep reflexes are 0 on the right side and 0 on the left side.      Brachioradialis reflexes are 0 on the right side and 0 on the left side.      Patellar reflexes are 0 on the right side and 2+ on the left side.      Achilles reflexes are 0 on the right side and 0 on the left side. Sensation intact to pinprick bilateral C5-C6 C7 C8 distribution Motor strength 5/5 right deltoid, bicep, tricep, grip, left deltoid,  bicep, tricep Grip is limited by chronic finger contractures third fourth and fifth digit left hand          Assessment & Plan:  1. Cervical spondylosis, degenerative disc as well as cervical radiculitis. Has cervical radiculitis but cannot isolate a single nerve root, most likely C5-6 levels. Neurologic exam is unremarkable. No signs of myelopathy. Overall radicular symptoms worse than axial neck pain symptoms Recommend Medrol Dosepak Recommended gabapentin 300 mg each bedtime to help with sleep since her radicular pain bothers her mostly at night Tramadol 1 tablet per os every 6 hours when necessary Physical therapy for cervical thoracic stabilization as well as manual traction. May benefit from traction device for home use Return to clinic one month Overall do not think that narcotic analgesics will be required on long-term basis Urine drug screen today

## 2013-05-12 NOTE — Progress Notes (Signed)
Urine drug screen from 05/02/2013 was consistent.

## 2013-05-13 ENCOUNTER — Ambulatory Visit: Payer: BC Managed Care – PPO | Attending: Physical Medicine & Rehabilitation | Admitting: Physical Therapy

## 2013-05-13 DIAGNOSIS — IMO0001 Reserved for inherently not codable concepts without codable children: Secondary | ICD-10-CM | POA: Insufficient documentation

## 2013-05-13 DIAGNOSIS — M542 Cervicalgia: Secondary | ICD-10-CM | POA: Insufficient documentation

## 2013-05-19 ENCOUNTER — Ambulatory Visit: Payer: BC Managed Care – PPO | Admitting: Physical Therapy

## 2013-05-22 ENCOUNTER — Ambulatory Visit: Payer: BC Managed Care – PPO | Admitting: Physical Therapy

## 2013-05-28 ENCOUNTER — Ambulatory Visit: Payer: BC Managed Care – PPO | Admitting: Physical Therapy

## 2013-05-29 ENCOUNTER — Ambulatory Visit (HOSPITAL_BASED_OUTPATIENT_CLINIC_OR_DEPARTMENT_OTHER): Payer: BC Managed Care – PPO | Admitting: Physical Medicine & Rehabilitation

## 2013-05-29 ENCOUNTER — Encounter: Payer: Self-pay | Admitting: Physical Medicine & Rehabilitation

## 2013-05-29 VITALS — BP 122/60 | HR 66 | Resp 14 | Ht 64.0 in | Wt 178.0 lb

## 2013-05-29 DIAGNOSIS — M5412 Radiculopathy, cervical region: Secondary | ICD-10-CM

## 2013-05-29 NOTE — Patient Instructions (Signed)
No more than 2 tramadol at a time  May take ibuprofen or aleve along with tramadol  Take gabapentin at night  Cont PT  See me in 1 month

## 2013-05-29 NOTE — Progress Notes (Signed)
Subjective:    Patient ID: Melanie Ewing, female    DOB: 09-May-1955, 58 y.o.   MRN: [id number]  HPI 2 PT visits had relief with traction. Also worked with upper back strengthening exercises. Has to more PT visits next week. Had some relief with gabapentin at night but needed to take 3 tablets rather than 1 tablet. Up to 3 tramadol for pain  We discussed at the maximum dose of tramadol is 2 tablets We also discussed at the patient may take ibuprofen along with the tramadol Pain Inventory Average Pain 8 Pain Right Now 6 My pain is sharp  In the last 24 hours, has pain interfered with the following? General activity 1 Relation with others 1 Enjoyment of life 4 What TIME of day is your pain at its worst? varies Sleep (in general) Poor  Pain is worse with: sitting Pain improves with: therapy/exercise Relief from Meds: 2  Mobility walk without assistance how many minutes can you walk? 30 ability to climb steps?  yes do you drive?  yes transfers alone Do you have any goals in this area?  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  no  Physicians involved in your care Any changes since last visit?  no   Family History  Problem Relation Age of Onset  . Arthritis Other   . Diabetes Other   . Hyperlipidemia Other   . Hypertension Other   . Stroke Other   . Alcohol abuse Mother    History   Social History  . Marital Status: Single    Spouse Name: N/A    Number of Children: N/A  . Years of Education: N/A   Occupational History  . CNA     [employer]   Social History Main Topics  . Smoking status: Former Smoker -- 0.50 packs/day    Types: Cigarettes    Quit date: 03/09/2012  . Smokeless tobacco: Never Used  . Alcohol Use: No     Comment: once a month  . Drug Use: No  . Sexual Activity: Not Currently   Other Topics Concern  . None   Social  History Narrative  . None   History reviewed. No pertinent past surgical history. Past Medical History  Diagnosis Date  . Arthritis    BP 122/60  Pulse 66  Resp 14  Ht 5' 4"  (1.626 m)  Wt 178 lb (80.74 kg)  BMI 30.54 kg/m2  SpO2 98%  Opioid Risk Score:   Fall Risk Score: Low Fall Risk (0-5 points) (pt educated on fall risk, brochure given to pt previously)   Review of Systems  Musculoskeletal: Positive for back pain.  Neurological: Positive for numbness.       Tingling  All other systems reviewed and are negative.      Objective:   Physical Exam  Constitutional: She is oriented to person, place, and time.  Musculoskeletal:  Scar palmar wrist Left , PIP and DIP contracture  Neurological: She is alert and oriented to person, place, and time. She has normal strength. No sensory deficit. Gait normal.  Reflex Scores:  Tricep reflexes are 0 on the right side and 0 on the left side.  Bicep reflexes are 0 on the right side and 0 on the left side.  Brachioradialis reflexes are 0 on the right side and 0 on the left side.  Patellar reflexes are 0 on  the right side and 2+ on the left side.  Achilles reflexes are 0 on the right side and 0 on the left side. Sensation intact to pinprick bilateral C5-C6 C7 C8 distribution Motor strength 5/5 right deltoid, bicep, tricep, grip, left deltoid, bicep, tricep Grip is limited by chronic finger contractures third fourth and fifth       Assessment & Plan:  1. Cervical spondylosis, degenerative disc as well as cervical radiculitis. Has cervical radiculitis but cannot isolate a single nerve root, most likely C5-6 levels. Neurologic exam is unremarkable. No signs of myelopathy.  Overall radicular symptoms worse than axial neck pain symptoms  Recommend Medrol Dosepak  Continue gabapentin 300 mg each bedtime, no further nighttime radicular pain , Tramadol 1-2  tablet per os every 6 hours when necessary , may use ibuprofen 2-3 tablets 3 times  per day as needed, take with food Physical therapy for cervical thoracic stabilization as well as manual traction. May benefit from traction device for home use  Return to clinic one month  Overall do not think that narcotic analgesics will be required on long-term basis

## 2013-06-02 ENCOUNTER — Ambulatory Visit: Payer: BC Managed Care – PPO | Attending: Physical Medicine & Rehabilitation | Admitting: Physical Therapy

## 2013-06-02 DIAGNOSIS — IMO0001 Reserved for inherently not codable concepts without codable children: Secondary | ICD-10-CM | POA: Insufficient documentation

## 2013-06-02 DIAGNOSIS — M542 Cervicalgia: Secondary | ICD-10-CM | POA: Insufficient documentation

## 2013-06-05 ENCOUNTER — Ambulatory Visit: Payer: BC Managed Care – PPO | Admitting: Physical Therapy

## 2013-06-10 ENCOUNTER — Ambulatory Visit: Payer: BC Managed Care – PPO | Admitting: Physical Therapy

## 2013-06-12 ENCOUNTER — Ambulatory Visit: Payer: BC Managed Care – PPO | Admitting: Internal Medicine

## 2013-06-12 ENCOUNTER — Ambulatory Visit: Payer: BC Managed Care – PPO | Admitting: Physical Therapy

## 2013-06-12 DIAGNOSIS — Z0289 Encounter for other administrative examinations: Secondary | ICD-10-CM

## 2013-06-17 ENCOUNTER — Ambulatory Visit: Payer: BC Managed Care – PPO | Admitting: Physical Therapy

## 2013-06-19 ENCOUNTER — Telehealth: Payer: Self-pay | Admitting: *Deleted

## 2013-06-19 NOTE — Telephone Encounter (Signed)
Left msg on triage stating md had rx something for pinch nerve. Having the same problem wanting md rx something for her pinch nerve...Melanie Ewing

## 2013-06-19 NOTE — Telephone Encounter (Signed)
This is being treated by a pain specialist

## 2013-06-20 ENCOUNTER — Ambulatory Visit: Payer: BC Managed Care – PPO | Admitting: Physical Therapy

## 2013-06-20 NOTE — Telephone Encounter (Signed)
Called pt no answer LMOM with md response.../lmb 

## 2013-06-23 ENCOUNTER — Other Ambulatory Visit: Payer: Self-pay | Admitting: Physical Medicine & Rehabilitation

## 2013-06-23 ENCOUNTER — Encounter: Payer: BC Managed Care – PPO | Attending: Physical Medicine & Rehabilitation

## 2013-06-23 ENCOUNTER — Ambulatory Visit: Payer: BC Managed Care – PPO | Admitting: Physical Medicine & Rehabilitation

## 2013-06-23 DIAGNOSIS — Z87891 Personal history of nicotine dependence: Secondary | ICD-10-CM | POA: Insufficient documentation

## 2013-06-23 DIAGNOSIS — M5412 Radiculopathy, cervical region: Secondary | ICD-10-CM | POA: Insufficient documentation

## 2013-06-23 DIAGNOSIS — M47812 Spondylosis without myelopathy or radiculopathy, cervical region: Secondary | ICD-10-CM | POA: Insufficient documentation

## 2013-07-11 ENCOUNTER — Other Ambulatory Visit: Payer: Self-pay

## 2013-07-11 DIAGNOSIS — Z1231 Encounter for screening mammogram for malignant neoplasm of breast: Secondary | ICD-10-CM

## 2013-07-21 ENCOUNTER — Other Ambulatory Visit: Payer: Self-pay | Admitting: Physical Medicine & Rehabilitation

## 2013-07-24 ENCOUNTER — Ambulatory Visit: Payer: BC Managed Care – PPO

## 2013-08-06 ENCOUNTER — Ambulatory Visit: Payer: BC Managed Care – PPO

## 2013-08-06 ENCOUNTER — Ambulatory Visit
Admission: RE | Admit: 2013-08-06 | Discharge: 2013-08-06 | Disposition: A | Discharge status: Home / Self Care | Payer: BC Managed Care – PPO | Source: Ambulatory Visit

## 2013-08-06 DIAGNOSIS — Z1231 Encounter for screening mammogram for malignant neoplasm of breast: Secondary | ICD-10-CM

## 2013-08-07 LAB — HM MAMMOGRAPHY: HM Mammogram: NORMAL

## 2013-08-12 ENCOUNTER — Telehealth: Payer: Self-pay | Admitting: *Deleted

## 2013-08-12 NOTE — Telephone Encounter (Signed)
Melanie Ewing called and she is having more pain. 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  She does not want injection, she is frightened of the prospect.  Her next appt with Dr Melanie Ewing is in September.  I have asked that her appt be moved up to earlier date with Melanie Ewing but if not she can see Melanie Ewing. She was given an appt 08/14/13 with Dr Melanie Ewing.

## 2013-08-14 ENCOUNTER — Encounter: Payer: BC Managed Care – PPO | Attending: Physical Medicine & Rehabilitation

## 2013-08-14 ENCOUNTER — Encounter: Payer: Self-pay | Admitting: Physical Medicine & Rehabilitation

## 2013-08-14 ENCOUNTER — Ambulatory Visit (HOSPITAL_BASED_OUTPATIENT_CLINIC_OR_DEPARTMENT_OTHER): Payer: BC Managed Care – PPO | Admitting: Physical Medicine & Rehabilitation

## 2013-08-14 VITALS — BP 140/81 | HR 60 | Resp 14 | Wt 177.2 lb

## 2013-08-14 DIAGNOSIS — M25529 Pain in unspecified elbow: Secondary | ICD-10-CM

## 2013-08-14 DIAGNOSIS — G8929 Other chronic pain: Secondary | ICD-10-CM

## 2013-08-14 DIAGNOSIS — M25521 Pain in right elbow: Principal | ICD-10-CM

## 2013-08-14 DIAGNOSIS — Z87891 Personal history of nicotine dependence: Secondary | ICD-10-CM | POA: Diagnosis not present

## 2013-08-14 DIAGNOSIS — M47812 Spondylosis without myelopathy or radiculopathy, cervical region: Secondary | ICD-10-CM | POA: Insufficient documentation

## 2013-08-14 DIAGNOSIS — M5412 Radiculopathy, cervical region: Secondary | ICD-10-CM | POA: Insufficient documentation

## 2013-08-14 DIAGNOSIS — R209 Unspecified disturbances of skin sensation: Secondary | ICD-10-CM

## 2013-08-14 MED ORDER — GABAPENTIN 300 MG PO CAPS: ORAL_CAPSULE | ORAL | Status: DC

## 2013-08-14 MED ORDER — TRAMADOL HCL 50 MG PO TABS: 50.0000 mg | ORAL_TABLET | Freq: Four times a day (QID) | ORAL | Status: DC | PRN

## 2013-08-14 NOTE — Progress Notes (Signed)
Subjective:    Patient ID: Melanie Ewing, female    DOB: [birth year], 58 y.o.   MRN: [id number]  HPI Patient working full-time 70 hours per week. Complaining of right elbow pain and numbness in the fingers of the right hand. Some right-sided neck and shoulder pain as well but this is not as severe Low back pain as well as right knee pain. The right knee pain is worse in the low back pain. 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  No knee or leg numbness  No bowel or bladder dysfunction  Pain Inventory Average Pain 8 Pain Right Now 6 My pain is sharp and stabbing  In the last 24 hours, has pain interfered with the following? General activity 5 Relation with others 1 Enjoyment of life 4 What TIME of day is your pain at its worst? all Sleep (in general) Poor  Pain is worse with: walking, sitting and inactivity Pain improves with: medication Relief from Meds: 2  Mobility walk without assistance how many minutes can you walk? 15 ability to climb steps?  yes do you drive?  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  no  Physicians involved in your care Any changes since last visit?  no   Family History  Problem Relation Age of Onset  . Arthritis Other   . Diabetes Other   . Hyperlipidemia Other   . Hypertension Other   . Stroke Other   . Alcohol abuse Mother    History   Social History  . Marital Status: Single    Spouse Name: N/A    Number of Children: N/A  . Years of Education: N/A   Occupational History  . CNA     [employer]   Social History Main Topics  . Smoking status: Former Smoker -- 0.50 packs/day    Types: Cigarettes    Quit date: 03/09/2012  . Smokeless tobacco: Never Used  . Alcohol Use: No     Comment: once a month  . Drug Use: No  . Sexual Activity: Not Currently   Other  Topics Concern  . None   Social History Narrative  . None   History reviewed. No pertinent past surgical history. Past Medical History  Diagnosis Date  . Arthritis    BP 140/81  Pulse 60  Resp 14  Wt 177 lb 3.2 oz (80.377 kg)  SpO2 98%  Opioid Risk Score:   Fall Risk Score: Low Fall Risk (0-5 points)  Review of Systems  Musculoskeletal:       Spasms  Neurological:       Tingling  All other systems reviewed and are negative.      Objective:   Physical Exam  Nursing note and vitals reviewed. Constitutional: She is oriented to person, place, and time. She appears well-developed and well-nourished.  HENT:  Head: Normocephalic and atraumatic.  Eyes: Conjunctivae and EOM are normal. Pupils are equal, round, and reactive to light.  Musculoskeletal:       Right elbow: Tenderness found. Medial epicondyle tenderness noted.  Tenderness over the pronator teres muscle of the right arm  Neurological: She is alert and oriented to person, place, and time. She has normal strength. A sensory deficit is present. Gait normal.  Decreased sensation in the right third and fourth digits  Psychiatric: She  has a normal mood and affect.          Assessment & Plan:  Impression 1. Right elbow pain as well as hand tingling. Differential includes medial epicondylitis plus carpal tunnel vs. cervical radiculitis vs. pronator teres syndrome Will evaluate with electrode diagnostic studies  2. Right knee pain lateral joint space tenderness. Suspect early OA vs. meniscal tear, patient does not desire further workup or treatment at this time. She wants to concentrate on her right upper extremity for

## 2013-08-26 ENCOUNTER — Encounter: Payer: Self-pay | Admitting: Internal Medicine

## 2013-09-12 ENCOUNTER — Ambulatory Visit: Payer: BC Managed Care – PPO | Admitting: Physical Medicine & Rehabilitation

## 2013-09-25 ENCOUNTER — Ambulatory Visit (HOSPITAL_BASED_OUTPATIENT_CLINIC_OR_DEPARTMENT_OTHER): Payer: BC Managed Care – PPO | Admitting: Physical Medicine & Rehabilitation

## 2013-09-25 ENCOUNTER — Encounter: Payer: BC Managed Care – PPO | Attending: Physical Medicine & Rehabilitation

## 2013-09-25 ENCOUNTER — Encounter: Payer: Self-pay | Admitting: Physical Medicine & Rehabilitation

## 2013-09-25 VITALS — BP 128/71 | HR 60 | Resp 14 | Wt 175.8 lb

## 2013-09-25 DIAGNOSIS — Z87891 Personal history of nicotine dependence: Secondary | ICD-10-CM | POA: Insufficient documentation

## 2013-09-25 DIAGNOSIS — R209 Unspecified disturbances of skin sensation: Secondary | ICD-10-CM

## 2013-09-25 DIAGNOSIS — M47812 Spondylosis without myelopathy or radiculopathy, cervical region: Secondary | ICD-10-CM | POA: Insufficient documentation

## 2013-09-25 DIAGNOSIS — G5620 Lesion of ulnar nerve, unspecified upper limb: Secondary | ICD-10-CM

## 2013-09-25 DIAGNOSIS — M5412 Radiculopathy, cervical region: Secondary | ICD-10-CM | POA: Diagnosis not present

## 2013-09-25 DIAGNOSIS — G5621 Lesion of ulnar nerve, right upper limb: Secondary | ICD-10-CM

## 2013-09-25 NOTE — Progress Notes (Signed)
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 No evidence of axonal degeneration. Please see EMG report dated 09/25/2013.

## 2013-09-25 NOTE — Patient Instructions (Signed)
The you have a pinched nerve at the elbow. This is called on Ulnar neuropathy at the elbow. Please avoid leaning on your right elbow

## 2013-10-02 ENCOUNTER — Encounter: Payer: Self-pay | Admitting: Physical Medicine & Rehabilitation

## 2013-10-08 ENCOUNTER — Telehealth: Payer: Self-pay | Admitting: *Deleted

## 2013-10-08 NOTE — Telephone Encounter (Signed)
May increase tramadol to 80m 2 tab po  TID, #180 1 RF

## 2013-10-08 NOTE — Telephone Encounter (Signed)
Melanie Ewing called to say that the medication for pain is not working and wondering if there is anything else she can take.   She is having sharp pains in her arms.

## 2013-10-09 NOTE — Telephone Encounter (Signed)
Called patient and left message that Dr. Letta Pate said to increase tramadol 50mg  2 tablets TID.

## 2013-10-27 ENCOUNTER — Ambulatory Visit: Payer: BC Managed Care – PPO | Admitting: Physical Medicine & Rehabilitation

## 2013-11-04 ENCOUNTER — Ambulatory Visit: Payer: BC Managed Care – PPO | Admitting: Physical Medicine & Rehabilitation

## 2013-11-09 ENCOUNTER — Other Ambulatory Visit: Payer: Self-pay | Admitting: Physical Medicine & Rehabilitation

## 2013-12-07 ENCOUNTER — Other Ambulatory Visit: Payer: Self-pay | Admitting: Physical Medicine & Rehabilitation

## 2013-12-18 ENCOUNTER — Other Ambulatory Visit: Payer: Self-pay | Admitting: Physical Medicine & Rehabilitation

## 2013-12-19 ENCOUNTER — Telehealth: Payer: Self-pay | Admitting: *Deleted

## 2013-12-19 NOTE — Telephone Encounter (Signed)
Calling because she tried to get refill and was told she was no longer a patient of Dr Letta Pate and doesn't know what this is about.  I called her back, no answer but left message on personally identified voicemail that I am not sure what she is speaking of but gabapentin was refilled 12/08/13 and if she has more questions to call office back.

## 2013-12-22 ENCOUNTER — Telehealth: Payer: Self-pay | Admitting: *Deleted

## 2013-12-22 NOTE — Telephone Encounter (Signed)
Pt says she missed our call on 12/19/13, Please call back

## 2013-12-23 MED ORDER — GABAPENTIN 300 MG PO CAPS: 300.0000 mg | ORAL_CAPSULE | Freq: Every day | ORAL | Status: DC

## 2013-12-23 MED ORDER — TRAMADOL HCL 50 MG PO TABS: 50.0000 mg | ORAL_TABLET | Freq: Four times a day (QID) | ORAL | Status: DC | PRN

## 2013-12-23 NOTE — Telephone Encounter (Signed)
I spoke with Melanie Ewing and refilled her gabapentin and tramadol and requested she schedule follow up in next few months.  Last appt was in Sept/2015 for EMG

## 2014-01-10 IMAGING — CR DG ANKLE COMPLETE 3+V*L*
3 series · 3 of 3 positions shown · non-contrast
Comparison: None

CLINICAL DATA: Lateral malleolar pain, fell 08/06/2011

LEFT ANKLE COMPLETE - 3+ VIEW

[view not recorded (1 of 3)]
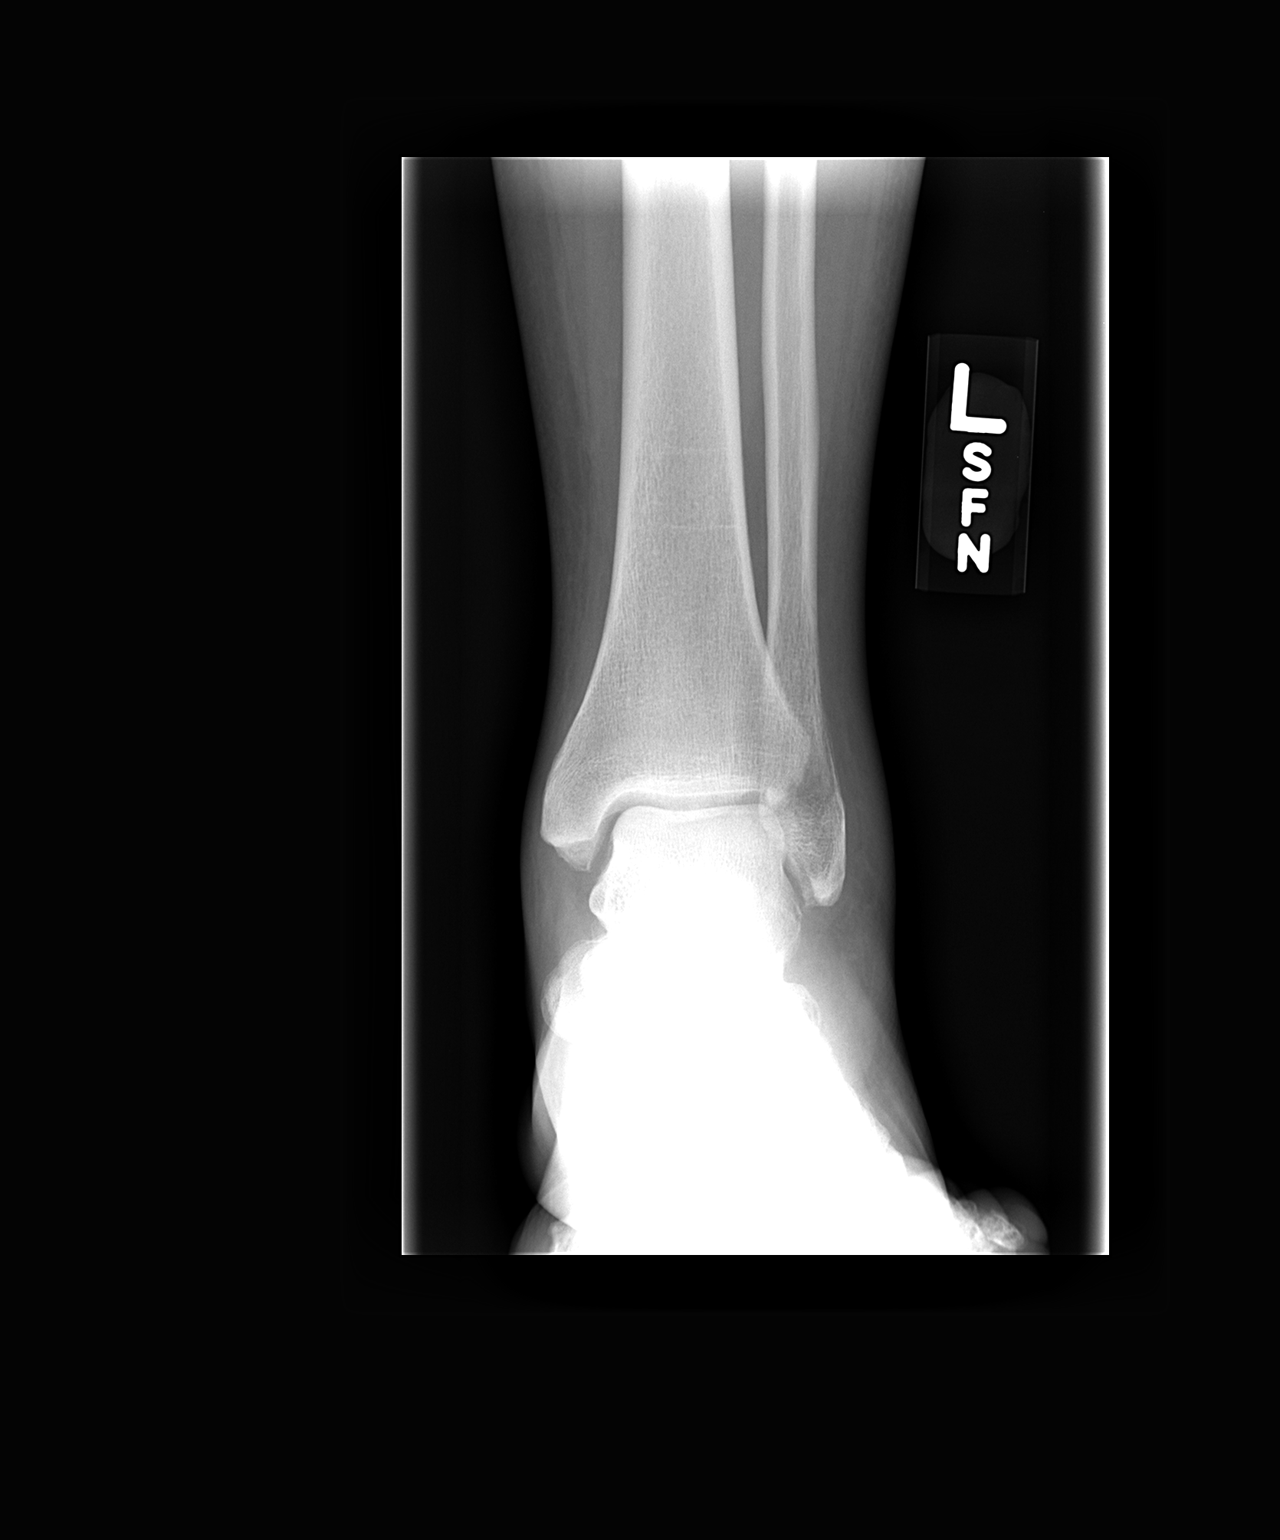

[view not recorded (2 of 3)]
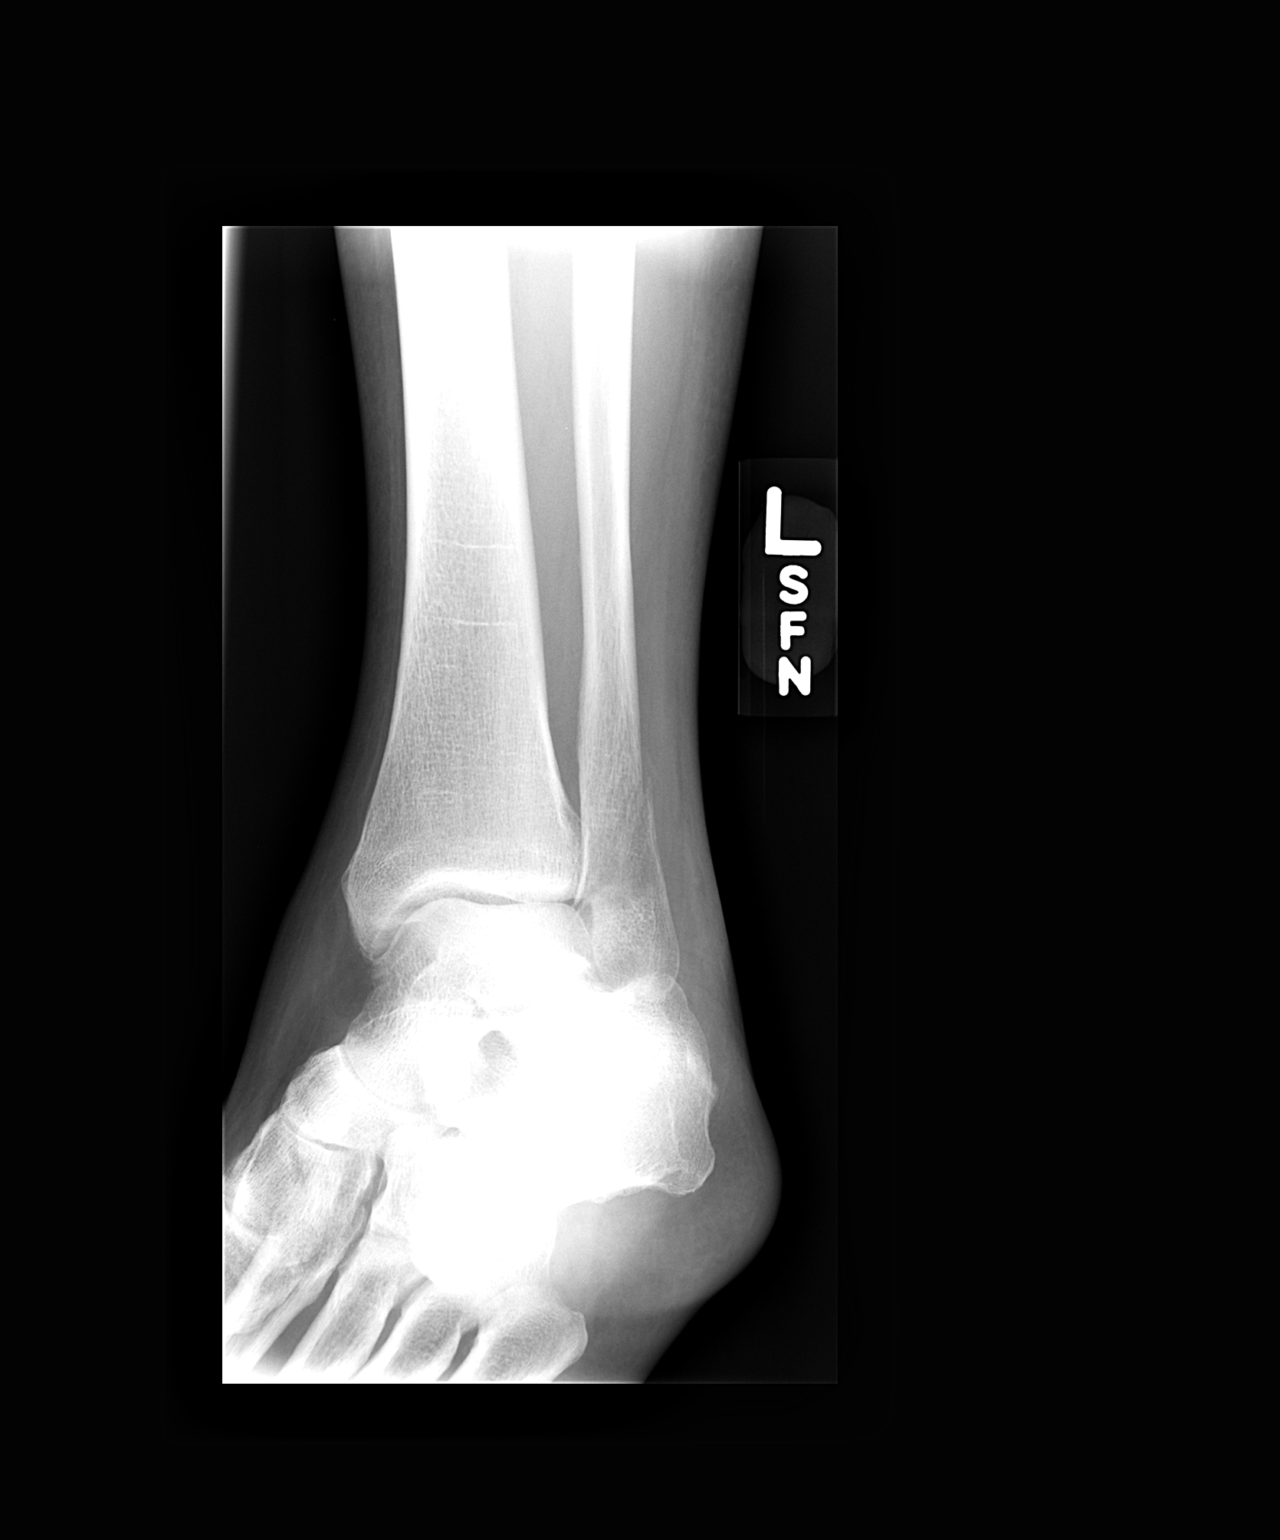

[view not recorded (3 of 3)]
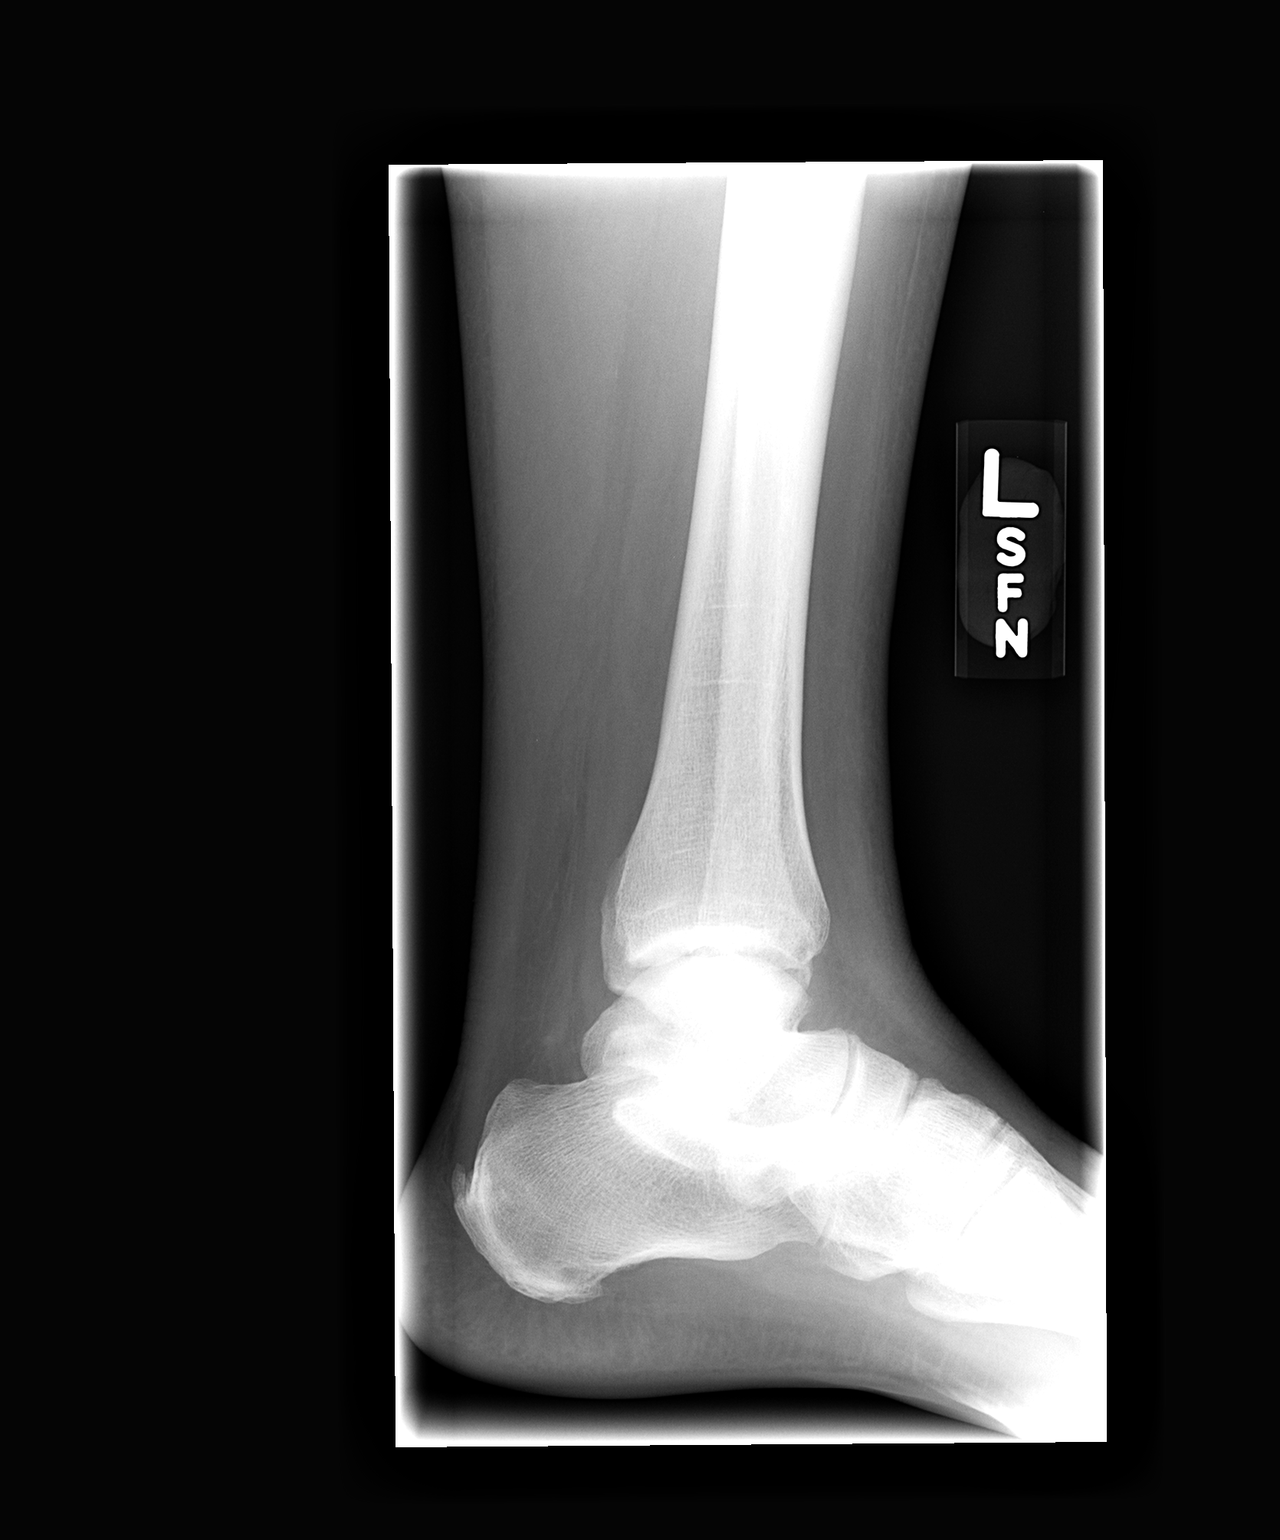

[3 of 3 positions shown; findings below may reference images not displayed]

FINDINGS: Osseous mineralization normal.
Soft tissue swelling at lateral ankle.
Minimally displaced oblique fracture of the lateral malleolus.
Ankle mortise intact.
No additional fracture or dislocation identified.
Tiny plantar calcaneal spurs.
IMPRESSION: Minimally displaced oblique fracture of the lateral malleolus.

## 2014-02-10 ENCOUNTER — Encounter: Payer: Self-pay | Admitting: Internal Medicine

## 2014-02-10 ENCOUNTER — Ambulatory Visit (INDEPENDENT_AMBULATORY_CARE_PROVIDER_SITE_OTHER)
Admission: RE | Admit: 2014-02-10 | Discharge: 2014-02-10 | Disposition: A | Discharge status: Home / Self Care | Payer: 59 | Source: Ambulatory Visit | Attending: Internal Medicine | Admitting: Internal Medicine

## 2014-02-10 ENCOUNTER — Ambulatory Visit (INDEPENDENT_AMBULATORY_CARE_PROVIDER_SITE_OTHER): Payer: 59 | Admitting: Internal Medicine

## 2014-02-10 VITALS — BP 132/86 | HR 63 | Temp 99.1°F | Resp 16 | Wt 176.0 lb

## 2014-02-10 DIAGNOSIS — M170 Bilateral primary osteoarthritis of knee: Secondary | ICD-10-CM

## 2014-02-10 DIAGNOSIS — S99922A Unspecified injury of left foot, initial encounter: Secondary | ICD-10-CM

## 2014-02-10 DIAGNOSIS — S8992XA Unspecified injury of left lower leg, initial encounter: Secondary | ICD-10-CM

## 2014-02-10 DIAGNOSIS — S99929A Unspecified injury of unspecified foot, initial encounter: Secondary | ICD-10-CM | POA: Insufficient documentation

## 2014-02-10 MED ORDER — INDOMETHACIN 40 MG PO CAPS: 1.0000 | ORAL_CAPSULE | Freq: Three times a day (TID) | ORAL | Status: DC | PRN

## 2014-02-10 NOTE — Progress Notes (Signed)
Pre visit review using our clinic review tool, if applicable. No additional management support is needed unless otherwise documented below in the visit note. 

## 2014-02-10 NOTE — Patient Instructions (Signed)

## 2014-02-11 ENCOUNTER — Encounter: Payer: Self-pay | Admitting: Internal Medicine

## 2014-02-11 DIAGNOSIS — S8992XA Unspecified injury of left lower leg, initial encounter: Secondary | ICD-10-CM | POA: Insufficient documentation

## 2014-02-11 NOTE — Assessment & Plan Note (Signed)
The exam is unremarkable and her xray is normal Will change her nsaids to try to get better pain relief

## 2014-02-11 NOTE — Progress Notes (Signed)
Subjective:    Patient ID: Melanie Ewing, female    DOB: 11/25/1955, 59 y.o.   MRN: [id number]  HPI  She had an injury about one month ago, she tells me that she slid down on a floor and hurt her left foot and left knee, she has persistent pain in these areas but she can still bear weight.  Review of Systems  Constitutional: Negative.  Negative for fever, chills, diaphoresis, appetite change and fatigue.  HENT: Negative.   Eyes: Negative.   Respiratory: Negative.  Negative for cough, choking, chest tightness, shortness of breath and stridor.   Cardiovascular: Negative.  Negative for chest pain, palpitations and leg swelling.  Gastrointestinal: Negative.  Negative for nausea, vomiting, abdominal pain, diarrhea and constipation.  Endocrine: Negative.   Genitourinary: Negative.   Musculoskeletal: Positive for arthralgias. Negative for myalgias, back pain and neck pain.  Skin: Negative.   Allergic/Immunologic: Negative.   Neurological: Negative.   Hematological: Negative.  Negative for adenopathy. Does not bruise/bleed easily.  Psychiatric/Behavioral: Negative.        Objective:   Physical Exam  Constitutional: She is oriented to person, place, and time. She appears well-developed and well-nourished. No distress.  HENT:  Head: Normocephalic and atraumatic.  Mouth/Throat: Oropharynx is clear and moist. No oropharyngeal exudate.  Eyes: Conjunctivae are normal. Right eye exhibits no discharge. Left eye exhibits no discharge. No scleral icterus.  Neck: Normal range of motion. Neck supple. No JVD present. No tracheal deviation present. No thyromegaly present.  Cardiovascular: Normal rate, regular rhythm and intact distal pulses.  Exam reveals no gallop and no friction rub.   No murmur heard. Pulmonary/Chest: Effort normal and breath sounds normal. No stridor. No respiratory distress. She has no wheezes. She has no rales. She exhibits no tenderness.  Abdominal: Soft. Bowel sounds  are normal. She exhibits no distension and no mass. There is no tenderness. There is no rebound and no guarding.  Musculoskeletal: Normal range of motion. She exhibits no edema.       Left knee: She exhibits normal range of motion, no swelling, no effusion, no ecchymosis, no deformity, no laceration, no erythema, normal alignment, no LCL laxity, normal patellar mobility and no bony tenderness. No tenderness found. No medial joint line and no lateral joint line tenderness noted.       Left foot: Normal. There is normal range of motion, no tenderness, no bony tenderness, no swelling, normal capillary refill, no crepitus, no deformity and no laceration.  Lymphadenopathy:    She has no cervical adenopathy.  Neurological: She is oriented to person, place, and time.  Skin: Skin is warm and dry. No rash noted. She is not diaphoretic. No erythema. No pallor.  Vitals reviewed.    Dg Knee Complete 4 Views Left  02/10/2014   CLINICAL DATA:  Status post fall 3 weeks ago with persistent medial knee pain  EXAM: LEFT KNEE - COMPLETE 4+ VIEW  COMPARISON:  None.  FINDINGS: The bones of the left knee are adequately mineralized. The joint compartments are well maintained. There is mild beaking of the tibial spines. No significant osteophyte formation is demonstrated. There is no joint effusion. The proximal fibula is intact.  IMPRESSION: There is no acute or significant chronic bony abnormality of the left knee.   Electronically Signed   By: David  Martinique   On: 02/10/2014 13:53   Dg Foot Complete Left  02/10/2014   CLINICAL DATA:  Anterior fourth metatarsal pain for 3 weeks.  EXAM:  LEFT FOOT - COMPLETE 3+ VIEW  COMPARISON:  None.  FINDINGS: There is a healing fracture of the neck of the fourth proximal phalanx with surrounding callus formation. There is no other fracture or dislocation. There is no evidence of arthropathy or other focal bone abnormality. Soft tissues are unremarkable.  IMPRESSION: Healing fracture of the  neck of the fourth proximal phalanx with surrounding callus formation.   Electronically Signed   By: Kathreen Devoid   On: 02/10/2014 13:52       Assessment & Plan:

## 2014-02-11 NOTE — Assessment & Plan Note (Signed)
Will change her nsaids to see if she will get better pain relief

## 2014-02-11 NOTE — Assessment & Plan Note (Signed)
The xray shows an old fracture that is healing with a callous - I don't think there is anything that should be done about this  Will cont tramadol for pain and will change her nsaids to get better pain relief

## 2014-02-18 ENCOUNTER — Telehealth: Payer: Self-pay

## 2014-02-18 NOTE — Telephone Encounter (Signed)
Received pharmacy rejection stating that insurance will not cover Tivorbex  without a prior authorization. Per [facility] Tivorbex denied due to plan exclusion for this member, no alternatives listed.

## 2014-04-08 ENCOUNTER — Other Ambulatory Visit: Payer: Self-pay | Admitting: Physical Medicine & Rehabilitation

## 2014-10-05 ENCOUNTER — Other Ambulatory Visit (INDEPENDENT_AMBULATORY_CARE_PROVIDER_SITE_OTHER): Payer: 59

## 2014-10-05 ENCOUNTER — Encounter: Payer: Self-pay | Admitting: Internal Medicine

## 2014-10-05 ENCOUNTER — Ambulatory Visit (INDEPENDENT_AMBULATORY_CARE_PROVIDER_SITE_OTHER): Payer: 59 | Admitting: Internal Medicine

## 2014-10-05 VITALS — BP 124/80 | HR 59 | Temp 98.4°F | Resp 18 | Ht 64.0 in | Wt 187.0 lb

## 2014-10-05 DIAGNOSIS — R519 Headache, unspecified: Secondary | ICD-10-CM

## 2014-10-05 DIAGNOSIS — R3589 Other polyuria: Secondary | ICD-10-CM

## 2014-10-05 DIAGNOSIS — R42 Dizziness and giddiness: Secondary | ICD-10-CM

## 2014-10-05 DIAGNOSIS — R358 Other polyuria: Secondary | ICD-10-CM | POA: Diagnosis not present

## 2014-10-05 DIAGNOSIS — R51 Headache: Secondary | ICD-10-CM

## 2014-10-05 DIAGNOSIS — R350 Frequency of micturition: Secondary | ICD-10-CM

## 2014-10-05 DIAGNOSIS — R7309 Other abnormal glucose: Secondary | ICD-10-CM | POA: Diagnosis not present

## 2014-10-05 LAB — BASIC METABOLIC PANEL
BUN: 14 mg/dL (ref 6–23)
CHLORIDE: 105 meq/L (ref 96–112)
CO2: 28 meq/L (ref 19–32)
Calcium: 9.1 mg/dL (ref 8.4–10.5)
Creatinine, Ser: 0.7 mg/dL (ref 0.40–1.20)
GFR: 110.91 mL/min (ref >60.00)
GLUCOSE: 94 mg/dL (ref 70–99)
POTASSIUM: 3.6 meq/L (ref 3.5–5.1)
SODIUM: 141 meq/L (ref 135–145)

## 2014-10-05 LAB — MICROALBUMIN / CREATININE URINE RATIO
Creatinine,U: 206.4 mg/dL
MICROALB/CREAT RATIO: 0.4 mg/g (ref 0.0–30.0)
Microalb, Ur: 0.8 mg/dL (ref 0.0–1.9)

## 2014-10-05 LAB — CBC WITH DIFFERENTIAL/PLATELET
BASOS PCT: 1.1 % (ref 0.0–3.0)
Basophils Absolute: 0.1 10*3/uL (ref 0.0–0.1)
EOS PCT: 8.2 % — AB (ref 0.0–5.0)
Eosinophils Absolute: 0.7 10*3/uL (ref 0.0–0.7)
HCT: 37.7 % (ref 36.0–46.0)
Hemoglobin: 12.4 g/dL (ref 12.0–15.0)
LYMPHS ABS: 3.5 10*3/uL (ref 0.7–4.0)
Lymphocytes Relative: 40 % (ref 12.0–46.0)
MCHC: 32.9 g/dL (ref 30.0–36.0)
MCV: 86.3 fl (ref 78.0–100.0)
MONO ABS: 0.6 10*3/uL (ref 0.1–1.0)
MONOS PCT: 6.4 % (ref 3.0–12.0)
NEUTROS ABS: 3.8 10*3/uL (ref 1.4–7.7)
NEUTROS PCT: 44.3 % (ref 43.0–77.0)
Platelets: 400 10*3/uL (ref 150.0–400.0)
RBC: 4.36 Mil/uL (ref 3.87–5.11)
RDW: 15.5 % (ref 11.5–15.5)
WBC: 8.6 10*3/uL (ref 4.0–10.5)

## 2014-10-05 LAB — URINALYSIS
BILIRUBIN URINE: NEGATIVE
Ketones, ur: NEGATIVE
LEUKOCYTES UA: NEGATIVE
Nitrite: NEGATIVE
PH: 6 (ref 5.0–8.0)
Specific Gravity, Urine: 1.025 (ref 1.000–1.030)
TOTAL PROTEIN, URINE-UPE24: NEGATIVE
Urine Glucose: NEGATIVE
Urobilinogen, UA: 0.2 (ref 0.0–1.0)

## 2014-10-05 LAB — HEMOGLOBIN A1C: HEMOGLOBIN A1C: 5.8 % (ref 4.6–6.5)

## 2014-10-05 LAB — SEDIMENTATION RATE: Sed Rate: 32 mm/hr — ABNORMAL HIGH (ref 0–22)

## 2014-10-05 MED ORDER — TRAMADOL HCL 50 MG PO TABS: 50.0000 mg | ORAL_TABLET | Freq: Four times a day (QID) | ORAL | Status: DC | PRN

## 2014-10-05 NOTE — Progress Notes (Signed)
Pre visit review using our clinic review tool, if applicable. No additional management support is needed unless otherwise documented below in the visit note. 

## 2014-10-05 NOTE — Progress Notes (Signed)
   Subjective:    Patient ID: Melanie Ewing, female    DOB: 1955-12-21, 59 y.o.   MRN: [id number]  HPI . The major concern is frequent urination and excessive urination. She states that she's always gone to the bathroom frequently after she drinks fluids. The excessive urination is both day and night.  For 3-4 weeks she's had some dizziness. This usually occurs when she gets out of bed. It can also occur if she  flexes @ the waist while up and active.  There is no benign positional vertigo.  She states that her diet is "junk". Her last glucose on record was 12/09/12 with a value of 111. A1c was 6.1% at that time. Her sister has diabetes  She also will have headaches 1-2 times per week. She cannot confirm how long these last. These are in the temples. She takes nothing for them.They're not associated with a neuromuscular deficit.  For several years she's had blurring of vision. She has occasional numbness in her feet. She also has occasional exertional dyspnea.   Review of Systems Fever, chills, sweats, or unexplained weight loss not present. Mental status change or memory loss denied. Diplopia or vision loss absent. Vertigo or near syncope denied. There is no weakness in extremities.   No loss of control of bladder or bowels. Radicular type pain absent. No seizure stigmata. Dysuria, pyuria,or hematuria are denied. She describes hip pain is requesting some pain medication for this.    Objective:   Physical Exam Pertinent or positive findings include: Cranial nerve exam is normal. She has keloid type hyperpigmented lesions of the lids, especially the lower right lid. She has complete dentures. She has slightly decreased hearing bilaterally. Flexion contractures are noted of the left third, fourth, and fifth fingers. Deep tendon reflexes are 0+ at the knees. She has slight crepitus of the knees.  General appearance :adequately nourished; in no distress.  Eyes: No conjunctival  inflammation or scleral icterus is present.  Oral exam:  Lips and gums are healthy appearing.There is no oropharyngeal erythema or exudate noted.   Heart:  Normal rate and regular rhythm. S1 and S2 normal without gallop, murmur, click, rub or other extra sounds    Lungs:Chest clear to auscultation; no wheezes, rhonchi,rales ,or rubs present.No increased work of breathing.   Abdomen: bowel sounds normal, soft and non-tender without masses, organomegaly or hernias noted.  No guarding or rebound. No flank tenderness to percussion.  Vascular : all pulses equal ; no bruits present.  Skin:Warm & dry.  Intact without suspicious lesions or rashes ; no tenting   Lymphatic: No lymphadenopathy is noted about the head, neck, axilla   Neuro: Strength, tone  normal.     Assessment & Plan:  #1 polyuria & frequency  #2 minor headache disorder  #3 dizziness with some postural noted  See orders

## 2014-10-05 NOTE — Assessment & Plan Note (Signed)
A1c , urine microalbumin, BMET

## 2014-10-05 NOTE — Patient Instructions (Signed)
  Your next office appointment will be determined based upon review of your pending labs. Those written interpretation of the lab results and instructions will be transmitted to you by mail for your records.  Critical results will be called.   Followup as needed for any active or acute issue. Please report any significant change in your symptoms. 

## 2016-01-17 ENCOUNTER — Encounter: Payer: Self-pay | Admitting: Internal Medicine

## 2016-01-17 ENCOUNTER — Ambulatory Visit (INDEPENDENT_AMBULATORY_CARE_PROVIDER_SITE_OTHER): Payer: BLUE CROSS/BLUE SHIELD | Admitting: Internal Medicine

## 2016-01-17 ENCOUNTER — Other Ambulatory Visit (INDEPENDENT_AMBULATORY_CARE_PROVIDER_SITE_OTHER): Payer: BLUE CROSS/BLUE SHIELD

## 2016-01-17 VITALS — BP 112/60 | HR 74 | Temp 98.5°F | Resp 16 | Ht 64.0 in | Wt 199.0 lb

## 2016-01-17 DIAGNOSIS — M21512 Acquired clawhand, left hand: Secondary | ICD-10-CM

## 2016-01-17 DIAGNOSIS — Z1231 Encounter for screening mammogram for malignant neoplasm of breast: Secondary | ICD-10-CM

## 2016-01-17 DIAGNOSIS — R7309 Other abnormal glucose: Secondary | ICD-10-CM

## 2016-01-17 DIAGNOSIS — K5900 Constipation, unspecified: Secondary | ICD-10-CM | POA: Diagnosis not present

## 2016-01-17 DIAGNOSIS — Z124 Encounter for screening for malignant neoplasm of cervix: Secondary | ICD-10-CM | POA: Diagnosis not present

## 2016-01-17 DIAGNOSIS — Z0000 Encounter for general adult medical examination without abnormal findings: Secondary | ICD-10-CM

## 2016-01-17 LAB — COMPREHENSIVE METABOLIC PANEL
ALBUMIN: 4.2 g/dL (ref 3.5–5.2)
ALT: 15 U/L (ref 0–35)
AST: 15 U/L (ref 0–37)
Alkaline Phosphatase: 92 U/L (ref 39–117)
BILIRUBIN TOTAL: 0.4 mg/dL (ref 0.2–1.2)
BUN: 14 mg/dL (ref 6–23)
CHLORIDE: 107 meq/L (ref 96–112)
CO2: 28 mEq/L (ref 19–32)
CREATININE: 0.86 mg/dL (ref 0.40–1.20)
Calcium: 9.9 mg/dL (ref 8.4–10.5)
GFR: 87.06 mL/min (ref >60.00)
Glucose, Bld: 95 mg/dL (ref 70–99)
Potassium: 4 mEq/L (ref 3.5–5.1)
SODIUM: 141 meq/L (ref 135–145)
Total Protein: 8 g/dL (ref 6.0–8.3)

## 2016-01-17 LAB — LIPID PANEL
CHOLESTEROL: 178 mg/dL (ref 0–200)
HDL: 55.3 mg/dL (ref >39.00)
LDL Cholesterol: 99 mg/dL (ref 0–99)
NONHDL: 123.17
TRIGLYCERIDES: 123 mg/dL (ref 0.0–149.0)
Total CHOL/HDL Ratio: 3
VLDL: 24.6 mg/dL (ref 0.0–40.0)

## 2016-01-17 LAB — CBC WITH DIFFERENTIAL/PLATELET
BASOS ABS: 0 10*3/uL (ref 0.0–0.1)
BASOS PCT: 0.4 % (ref 0.0–3.0)
EOS ABS: 0.3 10*3/uL (ref 0.0–0.7)
Eosinophils Relative: 3.6 % (ref 0.0–5.0)
HCT: 40.4 % (ref 36.0–46.0)
HEMOGLOBIN: 13.4 g/dL (ref 12.0–15.0)
Lymphocytes Relative: 49.4 % — ABNORMAL HIGH (ref 12.0–46.0)
Lymphs Abs: 4.6 10*3/uL — ABNORMAL HIGH (ref 0.7–4.0)
MCHC: 33.3 g/dL (ref 30.0–36.0)
MCV: 86.2 fl (ref 78.0–100.0)
MONO ABS: 0.7 10*3/uL (ref 0.1–1.0)
Monocytes Relative: 7.5 % (ref 3.0–12.0)
NEUTROS ABS: 3.6 10*3/uL (ref 1.4–7.7)
Neutrophils Relative %: 39.1 % — ABNORMAL LOW (ref 43.0–77.0)
PLATELETS: 480 10*3/uL — AB (ref 150.0–400.0)
RBC: 4.68 Mil/uL (ref 3.87–5.11)
RDW: 15.6 % — AB (ref 11.5–15.5)
WBC: 9.2 10*3/uL (ref 4.0–10.5)

## 2016-01-17 LAB — HEMOGLOBIN A1C: HEMOGLOBIN A1C: 6.1 % (ref 4.6–6.5)

## 2016-01-17 LAB — TSH: TSH: 1.96 u[IU]/mL (ref 0.35–4.50)

## 2016-01-17 LAB — MAGNESIUM: MAGNESIUM: 2 mg/dL (ref 1.5–2.5)

## 2016-01-17 MED ORDER — LINACLOTIDE 145 MCG PO CAPS: 145.0000 ug | ORAL_CAPSULE | Freq: Every day | ORAL | 3 refills | Status: DC

## 2016-01-17 NOTE — Progress Notes (Signed)
Pre visit review using our clinic review tool, if applicable. No additional management support is needed unless otherwise documented below in the visit note. 

## 2016-01-17 NOTE — Patient Instructions (Signed)
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
## 2016-01-20 ENCOUNTER — Encounter: Payer: Self-pay | Admitting: Internal Medicine

## 2016-01-31 ENCOUNTER — Ambulatory Visit (INDEPENDENT_AMBULATORY_CARE_PROVIDER_SITE_OTHER): Payer: Self-pay

## 2016-01-31 ENCOUNTER — Encounter (INDEPENDENT_AMBULATORY_CARE_PROVIDER_SITE_OTHER): Payer: Self-pay | Admitting: Orthopaedic Surgery

## 2016-01-31 ENCOUNTER — Ambulatory Visit (INDEPENDENT_AMBULATORY_CARE_PROVIDER_SITE_OTHER): Payer: BLUE CROSS/BLUE SHIELD | Admitting: Orthopaedic Surgery

## 2016-01-31 DIAGNOSIS — M79642 Pain in left hand: Secondary | ICD-10-CM | POA: Diagnosis not present
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
## 2016-02-01 ENCOUNTER — Telehealth: Payer: Self-pay | Admitting: Internal Medicine

## 2016-02-01 NOTE — Telephone Encounter (Signed)
Pt called in and said that she needs something for the pain that she is having in her left side.  She said that she express this to  Dr Waynard Reeds on the 15th when she had her Cpe

## 2016-02-02 NOTE — Telephone Encounter (Signed)
She needs to be seen about this

## 2016-02-03 NOTE — Telephone Encounter (Signed)
appt made for 02/08/16. Thanks

## 2016-02-08 ENCOUNTER — Ambulatory Visit (INDEPENDENT_AMBULATORY_CARE_PROVIDER_SITE_OTHER): Payer: BLUE CROSS/BLUE SHIELD | Admitting: Women's Health

## 2016-02-08 ENCOUNTER — Encounter: Payer: Self-pay | Admitting: Women's Health

## 2016-02-08 ENCOUNTER — Encounter: Payer: Self-pay | Admitting: Internal Medicine

## 2016-02-08 ENCOUNTER — Ambulatory Visit (INDEPENDENT_AMBULATORY_CARE_PROVIDER_SITE_OTHER): Payer: BLUE CROSS/BLUE SHIELD | Admitting: Internal Medicine

## 2016-02-08 VITALS — BP 128/86 | Ht 64.0 in | Wt 200.0 lb

## 2016-02-08 VITALS — BP 120/70 | HR 72 | Temp 98.0°F | Resp 16 | Ht 64.0 in | Wt 200.5 lb

## 2016-02-08 DIAGNOSIS — M170 Bilateral primary osteoarthritis of knee: Secondary | ICD-10-CM

## 2016-02-08 DIAGNOSIS — M25552 Pain in left hip: Secondary | ICD-10-CM

## 2016-02-08 DIAGNOSIS — M8949 Other hypertrophic osteoarthropathy, multiple sites: Secondary | ICD-10-CM | POA: Insufficient documentation

## 2016-02-08 DIAGNOSIS — Z13820 Encounter for screening for osteoporosis: Secondary | ICD-10-CM | POA: Diagnosis not present

## 2016-02-08 DIAGNOSIS — Z01419 Encounter for gynecological examination (general) (routine) without abnormal findings: Secondary | ICD-10-CM

## 2016-02-08 DIAGNOSIS — M150 Primary generalized (osteo)arthritis: Secondary | ICD-10-CM | POA: Diagnosis not present

## 2016-02-08 DIAGNOSIS — M159 Polyosteoarthritis, unspecified: Secondary | ICD-10-CM | POA: Insufficient documentation

## 2016-02-08 DIAGNOSIS — Z1151 Encounter for screening for human papillomavirus (HPV): Secondary | ICD-10-CM

## 2016-02-08 DIAGNOSIS — R140 Abdominal distension (gaseous): Secondary | ICD-10-CM

## 2016-02-08 LAB — HM PAP SMEAR

## 2016-02-08 MED ORDER — TRAMADOL HCL 50 MG PO TABS: 50.0000 mg | ORAL_TABLET | Freq: Four times a day (QID) | ORAL | 3 refills | Status: DC | PRN

## 2016-02-08 MED ORDER — HYDROCODONE-ACETAMINOPHEN 5-325 MG PO TABS: 1.0000 | ORAL_TABLET | Freq: Four times a day (QID) | ORAL | 0 refills | Status: DC | PRN

## 2016-02-08 NOTE — Progress Notes (Signed)
Subjective:  Patient ID: Melanie Ewing, female    DOB: Apr 28, 1955  Age: 61 y.o. MRN: [id number]  CC: Osteoarthritis   HPI Melanie Ewing presents for chronic, recurrent episodes of left hip pain. The pain increases with activity and makes it hard for her to complete her work. She has tried over-the-counter doses of anti-inflammatories with no relief from the symptoms. She denies any recent trauma or injury. She also suffers from bilateral knee pain. The pain keeps her awake at night.  Outpatient Medications Prior to Visit  Medication Sig Dispense Refill  . fish oil-omega-3 fatty acids 1000 MG capsule Take 1 g by mouth daily.    Marland Kitchen linaclotide (LINZESS) 145 MCG CAPS capsule Take 1 capsule (145 mcg total) by mouth daily before breakfast. (Patient not taking: Reported on 02/08/2016) 90 capsule 3   No facility-administered medications prior to visit.     ROS Review of Systems  Constitutional: Negative for activity change, appetite change, diaphoresis, fatigue and unexpected weight change.  HENT: Negative.  Negative for trouble swallowing.   Eyes: Negative for visual disturbance.  Respiratory: Negative for cough, chest tightness, shortness of breath and wheezing.   Cardiovascular: Negative for chest pain, palpitations and leg swelling.  Gastrointestinal: Positive for constipation. Negative for abdominal pain, diarrhea, nausea and vomiting.  Endocrine: Negative.   Genitourinary: Negative.  Negative for difficulty urinating.  Musculoskeletal: Positive for arthralgias. Negative for back pain, joint swelling and myalgias.  Skin: Negative.   Allergic/Immunologic: Negative.   Neurological: Negative.  Negative for dizziness, weakness, numbness and headaches.  Hematological: Negative for adenopathy. Does not bruise/bleed easily.  Psychiatric/Behavioral: Negative.     Objective:  BP 120/70 (BP Location: Left Arm, Patient Position: Sitting, Cuff Size: Normal)   Pulse 72   Temp 98 F  (36.7 C) (Oral)   Resp 16   Ht 5\' 4"  (1.626 m)   Wt 200 lb 8 oz (90.9 kg)   SpO2 97%   BMI 34.42 kg/m   BP Readings from Last 3 Encounters:  02/09/16 130/78  02/08/16 128/86  02/08/16 120/70    Wt Readings from Last 3 Encounters:  02/08/16 200 lb (90.7 kg)  02/08/16 200 lb 8 oz (90.9 kg)  01/17/16 199 lb (90.3 kg)    Physical Exam  Constitutional: She is oriented to person, place, and time. No distress.  HENT:  Mouth/Throat: Oropharynx is clear and moist. No oropharyngeal exudate.  Eyes: Conjunctivae are normal. Right eye exhibits no discharge. Left eye exhibits no discharge. No scleral icterus.  Neck: Normal range of motion. Neck supple. No JVD present. No tracheal deviation present. No thyromegaly present.  Cardiovascular: Normal rate, regular rhythm, normal heart sounds and intact distal pulses.  Exam reveals no gallop and no friction rub.   No murmur heard. Pulmonary/Chest: Effort normal and breath sounds normal. No stridor. No respiratory distress. She has no wheezes. She has no rales. She exhibits no tenderness.  Abdominal: Soft. Bowel sounds are normal. She exhibits no distension and no mass. There is no tenderness. There is no rebound and no guarding.  Musculoskeletal: Normal range of motion. She exhibits no edema, tenderness or deformity.  Lymphadenopathy:    She has no cervical adenopathy.  Neurological: She is oriented to person, place, and time.  Skin: Skin is warm and dry. No rash noted. She is not diaphoretic. No erythema. No pallor.  Vitals reviewed.   Lab Results  Component Value Date   WBC 9.2 01/17/2016   HGB 13.4 01/17/2016  HCT 40.4 01/17/2016   PLT 480.0 (H) 01/17/2016   GLUCOSE 95 01/17/2016   CHOL 178 01/17/2016   TRIG 123.0 01/17/2016   HDL 55.30 01/17/2016   LDLCALC 99 01/17/2016   ALT 15 01/17/2016   AST 15 01/17/2016   NA 141 01/17/2016   K 4.0 01/17/2016   CL 107 01/17/2016   CREATININE 0.86 01/17/2016   BUN 14 01/17/2016   CO2 28  01/17/2016   TSH 1.96 01/17/2016   HGBA1C 6.1 01/17/2016   MICROALBUR 0.8 10/05/2014    Dg Knee Complete 4 Views Left  Result Date: 02/10/2014 CLINICAL DATA:  Status post fall 3 weeks ago with persistent medial knee pain EXAM: LEFT KNEE - COMPLETE 4+ VIEW COMPARISON:  None. FINDINGS: The bones of the left knee are adequately mineralized. The joint compartments are well maintained. There is mild beaking of the tibial spines. No significant osteophyte formation is demonstrated. There is no joint effusion. The proximal fibula is intact. IMPRESSION: There is no acute or significant chronic bony abnormality of the left knee. Electronically Signed   By: David  Martinique   On: 02/10/2014 13:53   Dg Foot Complete Left  Result Date: 02/10/2014 CLINICAL DATA:  Anterior fourth metatarsal pain for 3 weeks. EXAM: LEFT FOOT - COMPLETE 3+ VIEW COMPARISON:  None. FINDINGS: There is a healing fracture of the neck of the fourth proximal phalanx with surrounding callus formation. There is no other fracture or dislocation. There is no evidence of arthropathy or other focal bone abnormality. Soft tissues are unremarkable. IMPRESSION: Healing fracture of the neck of the fourth proximal phalanx with surrounding callus formation. Electronically Signed   By: Kathreen Devoid   On: 02/10/2014 13:52    Assessment & Plan:   Melanie Ewing was seen today for osteoarthritis.  Diagnoses and all orders for this visit:  Primary osteoarthritis of both knees -     Discontinue: HYDROcodone-acetaminophen (NORCO) 5-325 MG tablet; Take 1 tablet by mouth every 6 (six) hours as needed for moderate pain. -     Discontinue: traMADol (ULTRAM) 50 MG tablet; Take 1 tablet (50 mg total) by mouth every 6 (six) hours as needed.  Pain of left hip joint - I will check a plain film to screen for DJD, spurring, AVN. In the meantime will treat the pain with Norco. -     DG HIP UNILAT WITH PELVIS MIN 4 VIEWS LEFT; Future -     Discontinue:  HYDROcodone-acetaminophen (NORCO) 5-325 MG tablet; Take 1 tablet by mouth every 6 (six) hours as needed for moderate pain. -     Discontinue: traMADol (ULTRAM) 50 MG tablet; Take 1 tablet (50 mg total) by mouth every 6 (six) hours as needed.  Primary osteoarthritis involving multiple joints -     Discontinue: HYDROcodone-acetaminophen (NORCO) 5-325 MG tablet; Take 1 tablet by mouth every 6 (six) hours as needed for moderate pain. -     Discontinue: traMADol (ULTRAM) 50 MG tablet; Take 1 tablet (50 mg total) by mouth every 6 (six) hours as needed.   I have discontinued Ms. Manfredonia's HYDROcodone-acetaminophen. I am also having her maintain her fish oil-omega-3 fatty acids and linaclotide.  Meds ordered this encounter  Medications  . DISCONTD: HYDROcodone-acetaminophen (NORCO) 5-325 MG tablet    Sig: Take 1 tablet by mouth every 6 (six) hours as needed for moderate pain.    Dispense:  90 tablet    Refill:  0  . DISCONTD: traMADol (ULTRAM) 50 MG tablet    Sig: Take  1 tablet (50 mg total) by mouth every 6 (six) hours as needed.    Dispense:  75 tablet    Refill:  3     Follow-up: Return if symptoms worsen or fail to improve.  Scarlette Calico, MD

## 2016-02-08 NOTE — Patient Instructions (Signed)
Osteoarthritis  Osteoarthritis is a type of arthritis that affects tissue that covers the ends of bones in joints (cartilage). Cartilage acts as a cushion between the bones and helps them move smoothly. Osteoarthritis results when cartilage in the joints gets worn down. Osteoarthritis is sometimes called "wear and tear" arthritis.  Osteoarthritis is the most common form of arthritis. It often occurs in older people. It is a condition that gets worse over time (a progressive condition). Joints that are most often affected by this condition are in:  · Fingers.  · Toes.  · Hips.  · Knees.  · Spine, including neck and lower back.    What are the causes?  This condition is caused by age-related wearing down of cartilage that covers the ends of bones.  What increases the risk?  The following factors may make you more likely to develop this condition:  · Older age.  · Being overweight or obese.  · Overuse of joints, such as in athletes.  · Past injury of a joint.  · Past surgery on a joint.  · Family history of osteoarthritis.    What are the signs or symptoms?  The main symptoms of this condition are pain, swelling, and stiffness in the joint. The joint may lose its shape over time. Small pieces of bone or cartilage may break off and float inside of the joint, which may cause more pain and damage to the joint. Small deposits of bone (osteophytes) may grow on the edges of the joint. Other symptoms may include:  · A grating or scraping feeling inside the joint when you move it.  · Popping or creaking sounds when you move.    Symptoms may affect one or more joints. Osteoarthritis in a major joint, such as your knee or hip, can make it painful to walk or exercise. If you have osteoarthritis in your hands, you might not be able to grip items, twist your hand, or control small movements of your hands and fingers (fine motor skills).  How is this diagnosed?  This condition may be diagnosed based on:  · Your medical history.  · A  physical exam.  · Your symptoms.  · X-rays of the affected joint(s).  · Blood tests to rule out other types of arthritis.    How is this treated?  There is no cure for this condition, but treatment can help to control pain and improve joint function. Treatment plans may include:  · A prescribed exercise program that allows for rest and joint relief. You may work with a physical therapist.  · A weight control plan.  · Pain relief techniques, such as:  ? Applying heat and cold to the joint.  ? Electric pulses delivered to nerve endings under the skin (transcutaneous electrical nerve stimulation, or TENS).  ? Massage.  ? Certain nutritional supplements.  · NSAIDs or prescription medicines to help relieve pain.  · Medicine to help relieve pain and inflammation (corticosteroids). This can be given by mouth (orally) or as an injection.  · Assistive devices, such as a brace, wrap, splint, specialized glove, or cane.  · Surgery, such as:  ? An osteotomy. This is done to reposition the bones and relieve pain or to remove loose pieces of bone and cartilage.  ? Joint replacement surgery. You may need this surgery if you have very bad (advanced) osteoarthritis.    Follow these instructions at home:  Activity   · Rest your affected joints as directed by your   health care provider.  · Do not drive or use heavy machinery while taking prescription pain medicine.  · Exercise as directed. Your health care provider or physical therapist may recommend specific types of exercise, such as:  ? Strengthening exercises. These are done to strengthen the muscles that support joints that are affected by arthritis. They can be performed with weights or with exercise bands to add resistance.  ? Aerobic activities. These are exercises, such as brisk walking or water aerobics, that get your heart pumping.  ? Range-of-motion activities. These keep your joints easy to move.  ? Balance and agility exercises.  Managing pain, stiffness, and swelling    · If directed, apply heat to the affected area as often as told by your health care provider. Use the heat source that your health care provider recommends, such as a moist heat pack or a heating pad.  ? If you have a removable assistive device, remove it as told by your health care provider.  ? Place a towel between your skin and the heat source. If your health care provider tells you to keep the assistive device on while you apply heat, place a towel between the assistive device and the heat source.  ? Leave the heat on for 20-30 minutes.  ? Remove the heat if your skin turns bright red. This is especially important if you are unable to feel pain, heat, or cold. You may have a greater risk of getting burned.  · If directed, put ice on the affected joint:  ? If you have a removable assistive device, remove it as told by your health care provider.  ? Put ice in a plastic bag.  ? Place a towel between your skin and the bag. If your health care provider tells you to keep the assistive device on during icing, place a towel between the assistive device and the bag.  ? Leave the ice on for 20 minutes, 2-3 times a day.  General instructions   · Take over-the-counter and prescription medicines only as told by your health care provider.  · Maintain a healthy weight. Follow instructions from your health care provider for weight control. These may include dietary restrictions.  · Do not use any products that contain nicotine or tobacco, such as cigarettes and e-cigarettes. These can delay bone healing. If you need help quitting, ask your health care provider.  · Use assistive devices as directed by your health care provider.  · Keep all follow-up visits as told by your health care provider. This is important.  Where to find more information:  · National Institute of Arthritis and Musculoskeletal and Skin Diseases: www.niams.nih.gov  · National Institute on Aging: www.nia.nih.gov  · American College of Rheumatology:  www.rheumatology.org  Contact a health care provider if:  · Your skin turns red.  · You develop a rash.  · You have pain that gets worse.  · You have a fever along with joint or muscle aches.  Get help right away if:  · You lose a lot of weight.  · You suddenly lose your appetite.  · You have night sweats.  Summary  · Osteoarthritis is a type of arthritis that affects tissue covering the ends of bones in joints (cartilage).  · This condition is caused by age-related wearing down of cartilage that covers the ends of bones.  · The main symptom of this condition is pain, swelling, and stiffness in the joint.  · There is no cure for this   condition, but treatment can help to control pain and improve joint function.  This information is not intended to replace advice given to you by your health care provider. Make sure you discuss any questions you have with your health care provider.  Document Released: 12/19/2004 Document Revised: 08/23/2015 Document Reviewed: 08/23/2015  Elsevier Interactive Patient Education © 2017 [employer]

## 2016-02-08 NOTE — Patient Instructions (Addendum)
Health Maintenance for Postmenopausal Women Introduction Menopause is a normal process in which your reproductive ability comes to an end. This process happens gradually over a span of months to years, usually between the ages of 20 and 50. Menopause is complete when you have missed 12 consecutive menstrual periods. It is important to talk with your health care provider about some of the most common conditions that affect postmenopausal women, such as heart disease, cancer, and bone loss (osteoporosis). Adopting a healthy lifestyle and getting preventive care can help to promote your health and wellness. Those actions can also lower your chances of developing some of these common conditions. What should I know about menopause? During menopause, you may experience a number of symptoms, such as:  Moderate-to-severe hot flashes.  Night sweats.  Decrease in sex drive.  Mood swings.  Headaches.  Tiredness.  Irritability.  Memory problems.  Insomnia. Choosing to treat or not to treat menopausal changes is an individual decision that you make with your health care provider. What should I know about hormone replacement therapy and supplements? Hormone therapy products are effective for treating symptoms that are associated with menopause, such as hot flashes and night sweats. Hormone replacement carries certain risks, especially as you become older. If you are thinking about using estrogen or estrogen with progestin treatments, discuss the benefits and risks with your health care provider. What should I know about heart disease and stroke? Heart disease, heart attack, and stroke become more likely as you age. This may be due, in part, to the hormonal changes that your body experiences during menopause. These can affect how your body processes dietary fats, triglycerides, and cholesterol. Heart attack and stroke are both medical emergencies. There are many things that you can do to help prevent  heart disease and stroke:  Have your blood pressure checked at least every 1-2 years. High blood pressure causes heart disease and increases the risk of stroke.  If you are 49-18 years old, ask your health care provider if you should take aspirin to prevent a heart attack or a stroke.  Do not use any tobacco products, including cigarettes, chewing tobacco, or electronic cigarettes. If you need help quitting, ask your health care provider.  It is important to eat a healthy diet and maintain a healthy weight.  Be sure to include plenty of vegetables, fruits, low-fat dairy products, and lean protein.  Avoid eating foods that are high in solid fats, added sugars, or salt (sodium).  Get regular exercise. This is one of the most important things that you can do for your health.  Try to exercise for at least 150 minutes each week. The type of exercise that you do should increase your heart rate and make you sweat. This is known as moderate-intensity exercise.  Try to do strengthening exercises at least twice each week. Do these in addition to the moderate-intensity exercise.  Know your numbers.Ask your health care provider to check your cholesterol and your blood glucose. Continue to have your blood tested as directed by your health care provider. What should I know about cancer screening? There are several types of cancer. Take the following steps to reduce your risk and to catch any cancer development as early as possible. Breast Cancer  Practice breast self-awareness.  This means understanding how your breasts normally appear and feel.  It also means doing regular breast self-exams. Let your health care provider know about any changes, no matter how small.  If you are 40 or  older, have a clinician do a breast exam (clinical breast exam or CBE) every year. Depending on your age, family history, and medical history, it may be recommended that you also have a yearly breast X-ray  (mammogram).  If you have a family history of breast cancer, talk with your health care provider about genetic screening.  If you are at high risk for breast cancer, talk with your health care provider about having an MRI and a mammogram every year.  Breast cancer (BRCA) gene test is recommended for women who have family members with BRCA-related cancers. Results of the assessment will determine the need for genetic counseling and BRCA1 and for BRCA2 testing. BRCA-related cancers include these types:  Breast. This occurs in males or females.  Ovarian.  Tubal. This may also be called fallopian tube cancer.  Cancer of the abdominal or pelvic lining (peritoneal cancer).  Prostate.  Pancreatic. Cervical, Uterine, and Ovarian Cancer  Your health care provider may recommend that you be screened regularly for cancer of the pelvic organs. These include your ovaries, uterus, and vagina. This screening involves a pelvic exam, which includes checking for microscopic changes to the surface of your cervix (Pap test).  For women ages 21-65, health care providers may recommend a pelvic exam and a Pap test every three years. For women ages 54-65, they may recommend the Pap test and pelvic exam, combined with testing for human papilloma virus (HPV), every five years. Some types of HPV increase your risk of cervical cancer. Testing for HPV may also be done on women of any age who have unclear Pap test results.  Other health care providers may not recommend any screening for nonpregnant women who are considered low risk for pelvic cancer and have no symptoms. Ask your health care provider if a screening pelvic exam is right for you.  If you have had past treatment for cervical cancer or a condition that could lead to cancer, you need Pap tests and screening for cancer for at least 20 years after your treatment. If Pap tests have been discontinued for you, your risk factors (such as having a new sexual  partner) need to be reassessed to determine if you should start having screenings again. Some women have medical problems that increase the chance of getting cervical cancer. In these cases, your health care provider may recommend that you have screening and Pap tests more often.  If you have a family history of uterine cancer or ovarian cancer, talk with your health care provider about genetic screening.  If you have vaginal bleeding after reaching menopause, tell your health care provider.  There are currently no reliable tests available to screen for ovarian cancer. Lung Cancer  Lung cancer screening is recommended for adults 43-52 years old who are at high risk for lung cancer because of a history of smoking. A yearly low-dose CT scan of the lungs is recommended if you:  Currently smoke.  Have a history of at least 30 pack-years of smoking and you currently smoke or have quit within the past 15 years. A pack-year is smoking an average of one pack of cigarettes per day for one year. Yearly screening should:  Continue until it has been 15 years since you quit.  Stop if you develop a health problem that would prevent you from having lung cancer treatment. Colorectal Cancer  This type of cancer can be detected and can often be prevented.  Routine colorectal cancer screening usually begins at age 65 and  continues through age 73.  If you have risk factors for colon cancer, your health care provider may recommend that you be screened at an earlier age.  If you have a family history of colorectal cancer, talk with your health care provider about genetic screening.  Your health care provider may also recommend using home test kits to check for hidden blood in your stool.  A small camera at the end of a tube can be used to examine your colon directly (sigmoidoscopy or colonoscopy). This is done to check for the earliest forms of colorectal cancer.  Direct examination of the colon should be  repeated every 5-10 years until age 39. However, if early forms of precancerous polyps or small growths are found or if you have a family history or genetic risk for colorectal cancer, you may need to be screened more often. Skin Cancer  Check your skin from head to toe regularly.  Monitor any moles. Be sure to tell your health care provider:  About any new moles or changes in moles, especially if there is a change in a mole's shape or color.  If you have a mole that is larger than the size of a pencil eraser.  If any of your family members has a history of skin cancer, especially at a young age, talk with your health care provider about genetic screening.  Always use sunscreen. Apply sunscreen liberally and repeatedly throughout the day.  Whenever you are outside, protect yourself by wearing long sleeves, pants, a wide-brimmed hat, and sunglasses. What should I know about osteoporosis? Osteoporosis is a condition in which bone destruction happens more quickly than new bone creation. After menopause, you may be at an increased risk for osteoporosis. To help prevent osteoporosis or the bone fractures that can happen because of osteoporosis, the following is recommended:  If you are 59-26 years old, get at least 1,000 mg of calcium and at least 600 mg of vitamin D per day.  If you are older than age 1 but younger than age 67, get at least 1,200 mg of calcium and at least 600 mg of vitamin D per day.  If you are older than age 38, get at least 1,200 mg of calcium and at least 800 mg of vitamin D per day. Smoking and excessive alcohol intake increase the risk of osteoporosis. Eat foods that are rich in calcium and vitamin D, and do weight-bearing exercises several times each week as directed by your health care provider. What should I know about how menopause affects my mental health? Depression may occur at any age, but it is more common as you become older. Common symptoms of depression  include:  Low or sad mood.  Changes in sleep patterns.  Changes in appetite or eating patterns.  Feeling an overall lack of motivation or enjoyment of activities that you previously enjoyed.  Frequent crying spells. Talk with your health care provider if you think that you are experiencing depression. What should I know about immunizations? It is important that you get and maintain your immunizations. These include:  Tetanus, diphtheria, and pertussis (Tdap) booster vaccine.  Influenza every year before the flu season begins.  Pneumonia vaccine.  Shingles vaccine. Your health care provider may also recommend other immunizations. This information is not intended to replace advice given to you by your health care provider. Make sure you discuss any questions you have with your health care provider. Document Released: 02/10/2005 Document Revised: 07/09/2015 Document Reviewed: 09/22/2014  2017  Elsevier Carbohydrate Counting for Diabetes Mellitus, Adult Carbohydrate counting is a method for keeping track of how many carbohydrates you eat. Eating carbohydrates naturally increases the amount of sugar (glucose) in the blood. Counting how many carbohydrates you eat helps keep your blood glucose within normal limits, which helps you manage your diabetes (diabetes mellitus). It is important to know how many carbohydrates you can safely have in each meal. This is different for every person. A diet and nutrition specialist (registered dietitian) can help you make a meal plan and calculate how many carbohydrates you should have at each meal and snack. Carbohydrates are found in the following foods:  Grains, such as breads and cereals.  Dried beans and soy products.  Starchy vegetables, such as potatoes, peas, and corn.  Fruit and fruit juices.  Milk and yogurt.  Sweets and snack foods, such as cake, cookies, candy, chips, and soft drinks. How do I count carbohydrates? There are two ways  to count carbohydrates in food. You can use either of the methods or a combination of both. Reading "Nutrition Facts" on packaged food  The "Nutrition Facts" list is included on the labels of almost all packaged foods and beverages in the [place] It includes:  The serving size.  Information about nutrients in each serving, including the grams (g) of carbohydrate per serving. To use the "Nutrition Facts":  Decide how many servings you will have.  Multiply the number of servings by the number of carbohydrates per serving.  The resulting number is the total amount of carbohydrates that you will be having. Learning standard serving sizes of other foods  When you eat foods containing carbohydrates that are not packaged or do not include "Nutrition Facts" on the label, you need to measure the servings in order to count the amount of carbohydrates:  Measure the foods that you will eat with a food scale or measuring cup, if needed.  Decide how many standard-size servings you will eat.  Multiply the number of servings by 15. Most carbohydrate-rich foods have about 15 g of carbohydrates per serving.  For example, if you eat 8 oz (170 g) of strawberries, you will have eaten 2 servings and 30 g of carbohydrates (2 servings x 15 g = 30 g).  For foods that have more than one food mixed, such as soups and casseroles, you must count the carbohydrates in each food that is included. The following list contains standard serving sizes of common carbohydrate-rich foods. Each of these servings has about 15 g of carbohydrates:   hamburger bun or  English muffin.   oz (15 mL) syrup.   oz (14 g) jelly.  1 slice of bread.  1 six-inch tortilla.  3 oz (85 g) cooked rice or pasta.  4 oz (113 g) cooked dried beans.  4 oz (113 g) starchy vegetable, such as peas, corn, or potatoes.  4 oz (113 g) hot cereal.  4 oz (113 g) mashed potatoes or  of a large baked potato.  4 oz (113 g) canned or frozen  fruit.  4 oz (120 mL) fruit juice.  4-6 crackers.  6 chicken nuggets.  6 oz (170 g) unsweetened dry cereal.  6 oz (170 g) plain fat-free yogurt or yogurt sweetened with artificial sweeteners.  8 oz (240 mL) milk.  8 oz (170 g) fresh fruit or one small piece of fruit.  24 oz (680 g) popped popcorn. Example of carbohydrate counting Sample meal  3 oz (85 g) chicken breast.    6 oz (170 g) brown rice.  4 oz (113 g) corn.  8 oz (240 mL) milk.  8 oz (170 g) strawberries with sugar-free whipped topping. Carbohydrate calculation 1. Identify the foods that contain carbohydrates:  Rice.  Corn.  Milk.  Strawberries. 2. Calculate how many servings you have of each food:  2 servings rice.  1 serving corn.  1 serving milk.  1 serving strawberries. 3. Multiply each number of servings by 15 g:  2 servings rice x 15 g = 30 g.  1 serving corn x 15 g = 15 g.  1 serving milk x 15 g = 15 g.  1 serving strawberries x 15 g = 15 g. 4. Add together all of the amounts to find the total grams of carbohydrates eaten:  30 g + 15 g + 15 g + 15 g = 75 g of carbohydrates total. This information is not intended to replace advice given to you by your health care provider. Make sure you discuss any questions you have with your health care provider. Document Released: 12/19/2004 Document Revised: 07/09/2015 Document Reviewed: 06/02/2015 Elsevier Interactive Patient Education  2017 [employer].

## 2016-02-08 NOTE — Progress Notes (Signed)
Pre visit review using our clinic review tool, if applicable. No additional management support is needed unless otherwise documented below in the visit note. 

## 2016-02-08 NOTE — Progress Notes (Signed)
Melanie Ewing 1955-01-19 [id number]    History:    Presents for annual exam.  Postmenopausal/no HRT/no bleeding. Not sexually active in years. Reports normal Pap history. Normal mammograms, last mammogram 2015. Has not had a screening DEXA. 09/2009 negative colonoscopy. Has had problems with chronic constipation IBS, currently on Linzess. Normal TSH, lipid panel and H&H at primary care, hemoglobin A1c 6.1.  Past medical history, past surgical history, family history and social history were all reviewed and documented in the EPIC chart. 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 Has 3 grown children and grandchildren all doing well. Mother alcoholic/deceased.  ROS:  A ROS was performed and pertinent positives and negatives are included.  Exam:  Vitals:   02/08/16 0932  BP: 128/86  Weight: 200 lb (90.7 kg)  Height: 5' 4"  (1.626 m)   Body mass index is 34.33 kg/m.   General appearance:  Normal Thyroid:  Symmetrical, normal in size, without palpable masses or nodularity. Respiratory  Auscultation:  Clear without wheezing or rhonchi Cardiovascular  Auscultation:  Regular rate, without rubs, murmurs or gallops  Edema/varicosities:  Not grossly evident Abdominal  Soft,nontender, without masses, guarding or rebound.  Liver/spleen:  No organomegaly noted  Hernia:  None appreciated  Skin  Inspection:  Grossly normal   Breasts: Examined lying and sitting.     Right: Without masses, retractions, discharge or axillary adenopathy.     Left: Without masses, retractions, discharge or axillary adenopathy. Gentitourinary   Inguinal/mons:  Normal without inguinal adenopathy  External genitalia:  Normal  BUS/Urethra/Skene's glands:  Normal  Vagina:  Normal  Cervix:  Normal  Uterus:   normal in size, shape and contour.  Midline and mobile  Adnexa/parametria:     Rt: Without masses or tenderness.   Lt: Without masses or tenderness.  Anus and perineum: Normal  Digital rectal  exam: Normal sphincter tone without palpated masses or tenderness  Assessment/Plan:  61 y.o. SWF G4P3 for annual exam with complaint of low abdominal pain/bloating/pressure intermittent for years.    Postmenopausal/no bleeding/no HRT Primary care manages labs IBS/constipation Obesity  Plan: Schedule ultrasound. Reviewed low abdominal discomfort most likely related to IBS constipation ultrasound to rule out GYN . Reviewed importance of decreasing simple sugars, carbs and diet to prevent type 2 diabetes. Strongly encouraged to increase regular cardio type exercise. SBE's, schedule annual screening mammogram reviewed importance. Vitamin D 2000 and over-the-counter supplement encourage daily. DEXA, Home safety, fall prevention and importance of exercise reviewed. Pap with HR HPV typing.Marland Kitchen    [place], 10:26 AM 02/08/2016

## 2016-02-09 ENCOUNTER — Ambulatory Visit (INDEPENDENT_AMBULATORY_CARE_PROVIDER_SITE_OTHER): Payer: BLUE CROSS/BLUE SHIELD

## 2016-02-09 ENCOUNTER — Encounter: Payer: Self-pay | Admitting: Women's Health

## 2016-02-09 ENCOUNTER — Other Ambulatory Visit: Payer: Self-pay | Admitting: Women's Health

## 2016-02-09 ENCOUNTER — Other Ambulatory Visit: Payer: Self-pay | Admitting: Gynecology

## 2016-02-09 ENCOUNTER — Ambulatory Visit (INDEPENDENT_AMBULATORY_CARE_PROVIDER_SITE_OTHER): Payer: BLUE CROSS/BLUE SHIELD | Admitting: Women's Health

## 2016-02-09 VITALS — BP 130/78

## 2016-02-09 DIAGNOSIS — D251 Intramural leiomyoma of uterus: Secondary | ICD-10-CM

## 2016-02-09 DIAGNOSIS — D259 Leiomyoma of uterus, unspecified: Secondary | ICD-10-CM | POA: Diagnosis not present

## 2016-02-09 DIAGNOSIS — R140 Abdominal distension (gaseous): Secondary | ICD-10-CM

## 2016-02-09 DIAGNOSIS — Z13820 Encounter for screening for osteoporosis: Secondary | ICD-10-CM

## 2016-02-09 LAB — PAP, TP IMAGING W/ HPV RNA, RFLX HPV TYPE 16,18/45: HPV mRNA, High Risk: NOT DETECTED

## 2016-02-09 NOTE — Progress Notes (Signed)
Presents for ultrasound. At annual exam reported increased bloating, lower abdominal pressure, increased urinary frequency without pain or burning. Postmenopausal on no HRT with no bleeding. IBS/constipation.  Exam: Appears well. Ultrasound: T/V retroverted uterus with fundal fibroid 37 x 26 x 33 mm. Right and left ovary normal. Negative cul-de-sac, no apparent mass in the right or left adnexal.  Small uterine fibroid IBS/constipation Abdominal bloating/low abdominal pressure  Plan: Reviewed small fibroids may be contributing to some of the abdominal pressure/bloating, more likely related to IBS/diet/constipation. Reviewed normality of ovaries. Reviewed importance of increasing fiber rich foods, regular exercise, avoiding processed foods, weight loss may also help.Marland Kitchen

## 2016-02-09 NOTE — Patient Instructions (Signed)
Carbohydrate Counting for Diabetes Mellitus, Adult Carbohydrate counting is a method for keeping track of how many carbohydrates you eat. Eating carbohydrates naturally increases the amount of sugar (glucose) in the blood. Counting how many carbohydrates you eat helps keep your blood glucose within normal limits, which helps you manage your diabetes (diabetes mellitus). It is important to know how many carbohydrates you can safely have in each meal. This is different for every person. A diet and nutrition specialist (registered dietitian) can help you make a meal plan and calculate how many carbohydrates you should have at each meal and snack. Carbohydrates are found in the following foods:  Grains, such as breads and cereals.  Dried beans and soy products.  Starchy vegetables, such as potatoes, peas, and corn.  Fruit and fruit juices.  Milk and yogurt.  Sweets and snack foods, such as cake, cookies, candy, chips, and soft drinks. How do I count carbohydrates? There are two ways to count carbohydrates in food. You can use either of the methods or a combination of both. Reading "Nutrition Facts" on packaged food  The "Nutrition Facts" list is included on the labels of almost all packaged foods and beverages in the [place] It includes:  The serving size.  Information about nutrients in each serving, including the grams (g) of carbohydrate per serving. To use the "Nutrition Facts":  Decide how many servings you will have.  Multiply the number of servings by the number of carbohydrates per serving.  The resulting number is the total amount of carbohydrates that you will be having. Learning standard serving sizes of other foods  When you eat foods containing carbohydrates that are not packaged or do not include "Nutrition Facts" on the label, you need to measure the servings in order to count the amount of carbohydrates:  Measure the foods that you will eat with a food scale or measuring  cup, if needed.  Decide how many standard-size servings you will eat.  Multiply the number of servings by 15. Most carbohydrate-rich foods have about 15 g of carbohydrates per serving.  For example, if you eat 8 oz (170 g) of strawberries, you will have eaten 2 servings and 30 g of carbohydrates (2 servings x 15 g = 30 g).  For foods that have more than one food mixed, such as soups and casseroles, you must count the carbohydrates in each food that is included. The following list contains standard serving sizes of common carbohydrate-rich foods. Each of these servings has about 15 g of carbohydrates:   hamburger bun or  English muffin.   oz (15 mL) syrup.   oz (14 g) jelly.  1 slice of bread.  1 six-inch tortilla.  3 oz (85 g) cooked rice or pasta.  4 oz (113 g) cooked dried beans.  4 oz (113 g) starchy vegetable, such as peas, corn, or potatoes.  4 oz (113 g) hot cereal.  4 oz (113 g) mashed potatoes or  of a large baked potato.  4 oz (113 g) canned or frozen fruit.  4 oz (120 mL) fruit juice.  4-6 crackers.  6 chicken nuggets.  6 oz (170 g) unsweetened dry cereal.  6 oz (170 g) plain fat-free yogurt or yogurt sweetened with artificial sweeteners.  8 oz (240 mL) milk.  8 oz (170 g) fresh fruit or one small piece of fruit.  24 oz (680 g) popped popcorn. Example of carbohydrate counting Sample meal  3 oz (85 g) chicken breast.  6 oz (  170 g) brown rice.  4 oz (113 g) corn.  8 oz (240 mL) milk.  8 oz (170 g) strawberries with sugar-free whipped topping. Carbohydrate calculation 1. Identify the foods that contain carbohydrates:  Rice.  Corn.  Milk.  Strawberries. 2. Calculate how many servings you have of each food:  2 servings rice.  1 serving corn.  1 serving milk.  1 serving strawberries. 3. Multiply each number of servings by 15 g:  2 servings rice x 15 g = 30 g.  1 serving corn x 15 g = 15 g.  1 serving milk x 15 g = 15  g.  1 serving strawberries x 15 g = 15 g. 4. Add together all of the amounts to find the total grams of carbohydrates eaten:  30 g + 15 g + 15 g + 15 g = 75 g of carbohydrates total. This information is not intended to replace advice given to you by your health care provider. Make sure you discuss any questions you have with your health care provider. Document Released: 12/19/2004 Document Revised: 07/09/2015 Document Reviewed: 06/02/2015 Elsevier Interactive Patient Education  2017 Elsevier Inc.  

## 2016-02-14 ENCOUNTER — Encounter: Payer: Self-pay | Admitting: Internal Medicine

## 2016-02-22 ENCOUNTER — Ambulatory Visit (INDEPENDENT_AMBULATORY_CARE_PROVIDER_SITE_OTHER): Payer: BLUE CROSS/BLUE SHIELD

## 2016-02-22 DIAGNOSIS — Z13820 Encounter for screening for osteoporosis: Secondary | ICD-10-CM | POA: Diagnosis not present

## 2016-04-18 ENCOUNTER — Emergency Department (HOSPITAL_COMMUNITY)
Admission: EM | Admit: 2016-04-18 | Discharge: 2016-04-18 | Disposition: A | Discharge status: Home / Self Care | Payer: BLUE CROSS/BLUE SHIELD | Attending: Emergency Medicine | Admitting: Emergency Medicine

## 2016-04-18 ENCOUNTER — Emergency Department (HOSPITAL_COMMUNITY): Payer: BLUE CROSS/BLUE SHIELD

## 2016-04-18 DIAGNOSIS — Z87891 Personal history of nicotine dependence: Secondary | ICD-10-CM | POA: Diagnosis not present

## 2016-04-18 DIAGNOSIS — W1839XA Other fall on same level, initial encounter: Secondary | ICD-10-CM | POA: Diagnosis not present

## 2016-04-18 DIAGNOSIS — S93402A Sprain of unspecified ligament of left ankle, initial encounter: Secondary | ICD-10-CM | POA: Diagnosis not present

## 2016-04-18 DIAGNOSIS — Y929 Unspecified place or not applicable: Secondary | ICD-10-CM | POA: Diagnosis not present

## 2016-04-18 DIAGNOSIS — Y939 Activity, unspecified: Secondary | ICD-10-CM | POA: Insufficient documentation

## 2016-04-18 DIAGNOSIS — Y999 Unspecified external cause status: Secondary | ICD-10-CM | POA: Insufficient documentation

## 2016-04-18 DIAGNOSIS — S99912A Unspecified injury of left ankle, initial encounter: Secondary | ICD-10-CM | POA: Diagnosis present

## 2016-04-18 DIAGNOSIS — Z79899 Other long term (current) drug therapy: Secondary | ICD-10-CM | POA: Insufficient documentation

## 2016-04-18 LAB — RAPID URINE DRUG SCREEN, HOSP PERFORMED
Amphetamines: NOT DETECTED
Barbiturates: NOT DETECTED
Benzodiazepines: NOT DETECTED
Cocaine: NOT DETECTED
Opiates: NOT DETECTED
Tetrahydrocannabinol: NOT DETECTED

## 2016-04-18 MED ORDER — IBUPROFEN 400 MG PO TABS
600.0000 mg | ORAL_TABLET | Freq: Once | ORAL | Status: AC
  Administered 2016-04-18: 600 mg via ORAL
  Filled 2016-04-18: qty 1

## 2016-04-18 MED ORDER — IBUPROFEN 600 MG PO TABS: 600.0000 mg | ORAL_TABLET | Freq: Four times a day (QID) | ORAL | 0 refills | Status: DC | PRN

## 2016-04-18 NOTE — Progress Notes (Signed)
Orthopedic Tech Progress Note Patient Details:  Melanie Ewing 1955/03/20 [id number]  Ortho Devices Type of Ortho Device: ASO, Crutches Ortho Device/Splint Interventions: Application   [name] 04/18/2016, 9:14 AM

## 2016-04-18 NOTE — Discharge Instructions (Signed)
Please read attached information. If you experience any new or worsening signs or symptoms please return to the emergency room for evaluation. Please follow-up with your primary care provider or specialist as discussed.  °

## 2016-04-18 NOTE — ED Triage Notes (Signed)
Pt states she fell as she was getting out of her car last night around 2200.  Pt caught herself with her hands.  Pt states her knees hit the ground. Pt c/o left ankle pain that was relieved by ice and elevation.

## 2016-04-18 NOTE — ED Notes (Signed)
Pt aware of need for urine pt requested the UDS but states she cannot go

## 2016-04-18 NOTE — ED Provider Notes (Signed)
[facility] DEPT Provider Note   CSN: [id number] Arrival date & time: 04/18/16  0700     History   Chief Complaint Chief Complaint  Patient presents with  . Ankle Pain    Left     HPI Melanie Ewing is a 61 y.o. female.  HPI   61 year old female presents today with left ankle pain.  Patient notes that she stepped in a hole in the grass yesterday causing her to fall down.  She reports that she had pain to the lateral aspect of her ankle and minor pain to the anterior left knee.  Patient notes she was able ambulate without significant difficulty last night.  She reports worsening pain today.  Patient denies any other injuries from the fall, no open wounds.  No medications prior to arrival.  Past Medical History:  Diagnosis Date  . Arthritis     Patient Active Problem List   Diagnosis Date Noted  . Fibroid uterus 02/09/2016  . Pain of left hip joint 02/08/2016  . 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 Left hand pain 01/31/2016  . Cervical cancer screening 01/17/2016  . Deformity, hand, claw, left 01/17/2016  . Cervical radiculitis 02/10/2013  . Other abnormal glucose 12/09/2012  . 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 Constipation 12/09/2012  . Routine general medical examination at a health care facility 05/20/2012  . Visit for screening mammogram 05/20/2012    No past surgical history on file.  OB History    Gravida Para Term Preterm AB Living   4 3     1 3    SAB TAB Ectopic Multiple Live Births   1              Home Medications    Prior to Admission medications   Medication Sig Start Date End Date Taking? Authorizing Provider  fish oil-omega-3 fatty acids 1000 MG capsule Take 1 g by mouth daily.    Historical Provider, MD  ibuprofen (ADVIL,MOTRIN) 600 MG tablet Take 1 tablet (600 mg total) by mouth every 6 (six) hours as needed. 04/18/16   Okey Regal, PA-C  linaclotide Rolan Lipa) 145 MCG CAPS capsule Take 1  capsule (145 mcg total) by mouth daily before breakfast. Patient not taking: Reported on 02/08/2016 01/17/16   Janith Lima, MD  Multiple Vitamin (MULTIVITAMIN) tablet Take 1 tablet by mouth daily.    Historical Provider, MD    Family History Family History  Problem Relation Age of Onset  . Arthritis Other   . Diabetes Other   . Hyperlipidemia Other   . Hypertension Other   . Stroke Other   . Alcohol abuse Mother   . Heart disease Father   . Heart disease Sister   . Diabetes Sister   . Cancer Neg Hx   . COPD Neg Hx   . Kidney disease Neg Hx     Social History Social History  Substance Use Topics  . Smoking status: Former Smoker    Packs/day: 0.50    Types: Cigarettes    Quit date: 03/09/2012  . Smokeless tobacco: Never Used  . Alcohol use No     Comment: once a month     Allergies   Patient has no known allergies.   Review of Systems Review of Systems  All other systems reviewed and are negative.   Physical Exam Updated Vital Signs BP 112/82 (BP Location: Right Arm)   Pulse 76   Temp 97.9 F (36.6 C) (Oral)  Resp 16   Ht 5\' 4"  (1.626 m)   Wt 90.3 kg   SpO2 98%   BMI 34.16 kg/m   Physical Exam  Constitutional: She is oriented to person, place, and time. She appears well-developed and well-nourished.  HENT:  Head: Normocephalic and atraumatic.  Eyes: Conjunctivae are normal. Pupils are equal, round, and reactive to light. Right eye exhibits no discharge. Left eye exhibits no discharge. No scleral icterus.  Neck: Normal range of motion. No JVD present. No tracheal deviation present.  Pulmonary/Chest: Effort normal. No stridor.  Musculoskeletal:  No obvious swelling or deformity to the left lower extremity.  Ankle with minor tenderness to palpation of the left lateral aspect, no significant laxity, sensation intact.  Cap refill less than 3 seconds, pedal pulse 2+  Minor tenderness to palpation of the left anterior knee, full active range of motion, no  swelling.  No tenderness to palpation of the proximal fibula  Neurological: She is alert and oriented to person, place, and time. Coordination normal.  Psychiatric: She has a normal mood and affect. Her behavior is normal. Judgment and thought content normal.  Nursing note and vitals reviewed.    ED Treatments / Results  Labs (all labs ordered are listed, but only abnormal results are displayed) Labs Reviewed  RAPID URINE DRUG SCREEN, HOSP PERFORMED    EKG  EKG Interpretation None       Radiology Dg Ankle Complete Left  Result Date: 04/18/2016 CLINICAL DATA:  61 year old female status post twisting injury last night with lateral pain and swelling. Initial encounter. EXAM: LEFT ANKLE COMPLETE - 3+ VIEW COMPARISON:  Left ankle series 08/28/11. FINDINGS: Mortise joint alignment is preserved. Talar dome intact. No ankle joint effusion. Mostly anterior soft tissue swelling and stranding is evident. Mild anterior tibia and talar degenerative spurring noted. Chronic but increased degenerative spurring at the lateral malleolus. No acute fracture of the distal tibia or fibula identified. Calcaneus intact with mild degenerative spurring. No acute osseous abnormality identified. IMPRESSION: Degenerative osteophytosis about the left ankle. No acute fracture or dislocation identified. Electronically Signed   By: Genevie Ann M.D.   On: 04/18/2016 07:49    Procedures Procedures (including critical care time)  SPLINT APPLICATION Date/Time: 1:30 PM Authorized by: Elmer Ramp Consent: Verbal consent obtained. Risks and benefits: risks, benefits and alternatives were discussed Consent given by: patient Splint applied by: orthopedic technician Location details: left ankle Splint type: velcro Supplies used: aso Post-procedure: The splinted body part was neurovascularly unchanged following the procedure. Patient tolerance: Patient tolerated the procedure well with no immediate  complications.    Medications Ordered in ED Medications  ibuprofen (ADVIL,MOTRIN) tablet 600 mg (600 mg Oral Given 04/18/16 0757)  ibuprofen (ADVIL,MOTRIN) tablet 600 mg (600 mg Oral Given 04/18/16 1112)     Initial Impression / Assessment and Plan / ED Course  I have reviewed the triage vital signs and the nursing notes.  Pertinent labs & imaging results that were available during my care of the patient were reviewed by me and considered in my medical decision making (see chart for details).     Final Clinical Impressions(s) / ED Diagnoses   Final diagnoses:  Sprain of left ankle, unspecified ligament, initial encounter     Imaging: DG ankle complete left  Consults:  Therapeutics: Ibuprofen  Discharge Meds:   Assessment/Plan: 61 year old female presents today with left ankle pain.  She has no obvious signs of trauma on exam.  Patient does have lateral malleolar tenderness to palpation.  She will have plain films done here.  Low suspicion for fracture.  If no fracture noted on exam, no significant laxity or high-grade ankle sprain.  She will be placed in ASO, given crutches and referred to her primary care for ongoing management of her ankle injury.  Patient will be given return precautions and symptom Medicare instructions.    New Prescriptions Discharge Medication List as of 04/18/2016 10:51 AM       Okey Regal, PA-C 04/18/16 1539    Charlesetta Shanks, MD 04/18/16 1739

## 2016-04-18 NOTE — ED Notes (Signed)
Papers reviewed with patient and she demonstrates how to use crutches and understanding of splint car e

## 2016-06-01 ENCOUNTER — Encounter: Payer: Self-pay | Admitting: Family

## 2016-06-01 ENCOUNTER — Ambulatory Visit (INDEPENDENT_AMBULATORY_CARE_PROVIDER_SITE_OTHER): Payer: BLUE CROSS/BLUE SHIELD | Admitting: Family

## 2016-06-01 VITALS — BP 118/80 | HR 81 | Temp 98.4°F | Resp 16 | Ht 64.0 in | Wt 201.8 lb

## 2016-06-01 DIAGNOSIS — S93402A Sprain of unspecified ligament of left ankle, initial encounter: Secondary | ICD-10-CM | POA: Insufficient documentation

## 2016-06-01 MED ORDER — IBUPROFEN-FAMOTIDINE 800-26.6 MG PO TABS: 1.0000 | ORAL_TABLET | Freq: Three times a day (TID) | ORAL | 1 refills | Status: DC | PRN

## 2016-06-01 NOTE — Progress Notes (Signed)
Subjective:    Patient ID: Melanie Ewing, female    DOB: 1955-09-23, 61 y.o.   MRN: [id number]  Chief Complaint  Patient presents with  . Ankle Pain    left ankle gets sharp pain, sprained that ankle over a month ago    HPI:  Melanie Ewing is a 61 y.o. female who  has a past medical history of Arthritis. and presents today for an office visit.  This is a new problem. Associated symptom of pain described as a tingling feeling at times has been going waxing and waning since she sprained her ankle about 1 month ago when she stepped in a hole. . Modifying factors include a lace up ankle brace and ibpuprofen which does help a little. No sounds/sensations heard or felt. Has had previous ankle sprains to the left ankle.    No Known Allergies    Outpatient Medications Prior to Visit  Medication Sig Dispense Refill  . fish oil-omega-3 fatty acids 1000 MG capsule Take 1 g by mouth daily.    Marland Kitchen linaclotide (LINZESS) 145 MCG CAPS capsule Take 1 capsule (145 mcg total) by mouth daily before breakfast. 90 capsule 3  . Multiple Vitamin (MULTIVITAMIN) tablet Take 1 tablet by mouth daily.    Marland Kitchen ibuprofen (ADVIL,MOTRIN) 600 MG tablet Take 1 tablet (600 mg total) by mouth every 6 (six) hours as needed. 30 tablet 0   No facility-administered medications prior to visit.       No past surgical history on file.    Past Medical History:  Diagnosis Date  . Arthritis       Review of Systems  Constitutional: Negative for chills and fever.  Musculoskeletal:       Positive for left ankle pain.   Neurological: Negative for weakness and numbness.      Objective:    BP 118/80 (BP Location: Left Arm, Patient Position: Sitting, Cuff Size: Large)   Pulse 81   Temp 98.4 F (36.9 C) (Oral)   Resp 16   Ht 5' 4"  (1.626 m)   Wt 201 lb 12.8 oz (91.5 kg)   SpO2 97%   BMI 34.64 kg/m  Nursing note and vital signs reviewed.  Physical Exam  Constitutional: She is oriented to person,  place, and time. She appears well-developed and well-nourished. No distress.  Cardiovascular: Normal rate, regular rhythm, normal heart sounds and intact distal pulses.   Pulmonary/Chest: Effort normal and breath sounds normal.  Musculoskeletal:  Left ankle - no obvious deformity or discoloration with mild edema. Tenderness elicited over anterior talofibular, calcaneofibular, and posterior talofibular ligaments. Range of motion within normal limits with discomfort noted in the plantar flexion and inversion. Pulses and sensation are intact and appropriate. Positive talar tilt. Negative kleiger's. Positive anterior draw.   Neurological: She is alert and oriented to person, place, and time.  Skin: Skin is warm and dry.  Psychiatric: She has a normal mood and affect. Her behavior is normal. Judgment and thought content normal.       Assessment & Plan:   Problem List Items Addressed This Visit      Musculoskeletal and Integument   Moderate left ankle sprain - Primary    New-onset lateral ankle sprain of moderate severity with positive talar tilt. Treat conservatively with ice, place up ankle brace, and home exercise therapy. Start Duexis as needed for inflammation. Advised to wear a good supportive shoe. Follow-up if symptoms worsen or do not improve for physical therapy if indicated.  I have discontinued Ms. Shivley's ibuprofen. I am also having her start on Ibuprofen-Famotidine. Additionally, I am having her maintain her fish oil-omega-3 fatty acids, linaclotide, and multivitamin.   Meds ordered this encounter  Medications  . Ibuprofen-Famotidine 800-26.6 MG TABS    Sig: Take 1 tablet by mouth 3 (three) times daily as needed.    Dispense:  90 tablet    Refill:  1    Order Specific Question:   Supervising Provider    Answer:   Pricilla Holm A [[id number]]     Follow-up: Return in about 1 month (around 07/01/2016), or if symptoms worsen or fail to improve.  Mauricio Po,  FNP

## 2016-06-01 NOTE — Assessment & Plan Note (Signed)
New-onset lateral ankle sprain of moderate severity with positive talar tilt. Treat conservatively with ice, place up ankle brace, and home exercise therapy. Start Duexis as needed for inflammation. Advised to wear a good supportive shoe. Follow-up if symptoms worsen or do not improve for physical therapy if indicated.

## 2016-06-01 NOTE — Patient Instructions (Signed)
Thank you for choosing [facility].  SUMMARY AND INSTRUCTIONS:  Ice x 20 minutes every 2 hours and as needed following activity.  Lace up ankle brace for support.   Ibuprofen as needed for discomfort and edema.  Stretches and exercise 2-3 times per day.  Follow up if symptoms worsen or do not improve.   Medication:  Your prescription(s) have been submitted to your pharmacy or been printed and provided for you. Please take as directed and contact our office if you believe you are having problem(s) with the medication(s) or have any questions.   Follow up:  If your symptoms worsen or fail to improve, please contact our office for further instruction, or in case of emergency go directly to the emergency room at the closest medical facility.    Ankle Sprain, Phase I Rehab Ask your health care provider which exercises are safe for you. Do exercises exactly as told by your health care provider and adjust them as directed. It is normal to feel mild stretching, pulling, tightness, or discomfort as you do these exercises, but you should stop right away if you feel sudden pain or your pain gets worse.Do not begin these exercises until told by your health care provider. Stretching and range of motion exercises These exercises warm up your muscles and joints and improve the movement and flexibility of your lower leg and ankle. These exercises also help to relieve pain and stiffness. Exercise A: Gastroc and soleus stretch  1. Sit on the floor with your left / right leg extended. 2. Loop a belt or towel around the ball of your left / right foot. The ball of your foot is on the walking surface, right under your toes. 3. Keep your left / right ankle and foot relaxed and keep your knee straight while you use the belt or towel to pull your foot toward you. You should feel a gentle stretch behind your calf or knee. 4. Hold this position for __________ seconds, then release to the starting  position. Repeat the exercise with your knee bent. You can put a pillow or a rolled bath towel under your knee to support it. You should feel a stretch deep in your calf or at your Achilles tendon. Repeat each stretch __________ times. Complete these stretches __________ times a day. Exercise B: Ankle alphabet  1. Sit with your left / right leg supported at the lower leg. ? Do not rest your foot on anything. ? Make sure your foot has room to move freely. 2. Think of your left / right foot as a paintbrush, and move your foot to trace each letter of the alphabet in the air. Keep your hip and knee still while you trace. Make the letters as large as you can without feeling discomfort. 3. Trace every letter from A to Z. Repeat __________ times. Complete this exercise __________ times a day. Strengthening exercises These exercises build strength and endurance in your ankle and lower leg. Endurance is the ability to use your muscles for a long time, even after they get tired. Exercise C: Dorsiflexors  1. Secure a rubber exercise band or tube to an object, such as a table leg, that will stay still when the band is pulled. Secure the other end around your left / right foot. 2. Sit on the floor facing the object, with your left / right leg extended. The band or tube should be slightly tense when your foot is relaxed. 3. Slowly bring your foot toward you,  pulling the band tighter. 4. Hold this position for __________ seconds. 5. Slowly return your foot to the starting position. Repeat __________ times. Complete this exercise __________ times a day. Exercise D: Plantar flexors  1. Sit on the floor with your left / right leg extended. 2. Loop a rubber exercise tube or band around the ball of your left / right foot. The ball of your foot is on the walking surface, right under your toes. ? Hold the ends of the band or tube in your hands. ? The band or tube should be slightly tense when your foot is  relaxed. 3. Slowly point your foot and toes downward, pushing them away from you. 4. Hold this position for __________ seconds. 5. Slowly return your foot to the starting position. Repeat __________ times. Complete this exercise __________ times a day. Exercise E: Evertors 1. Sit on the floor with your legs straight out in front of you. 2. Loop a rubber exercise band or tube around the ball of your left / right foot. The ball of your foot is on the walking surface, right under your toes. ? Hold the ends of the band in your hands, or secure the band to a stable object. ? The band or tube should be slightly tense when your foot is relaxed. 3. Slowly push your foot outward, away from your other leg. 4. Hold this position for __________ seconds. 5. Slowly return your foot to the starting position. Repeat __________ times. Complete this exercise __________ times a day. This information is not intended to replace advice given to you by your health care provider. Make sure you discuss any questions you have with your health care provider. Document Released: 07/20/2004 Document Revised: 08/26/2015 Document Reviewed: 11/02/2014 Elsevier Interactive Patient Education  2018 [employer].   Ankle Sprain, Phase II Rehab Ask your health care provider which exercises are safe for you. Do exercises exactly as told by your health care provider and adjust them as directed. It is normal to feel mild stretching, pulling, tightness, or discomfort as you do these exercises, but you should stop right away if you feel sudden pain or your pain gets worse.Do not begin these exercises until told by your health care provider. Stretching and range of motion exercises These exercises warm up your muscles and joints and improve the movement and flexibility of your lower leg and ankle. These exercises also help to relieve pain and stiffness. Exercise A: Gastroc stretch, standing  5. Stand with your hands against a  wall. 6. Extend your left / right leg behind you, and bend your front knee slightly. Your heels should be on the floor. 7. Keeping your heels on the floor and your back knee straight, shift your weight toward the wall. You should feel a gentle stretch in the back of your lower leg (calf). 8. Hold this position for __________ seconds. Repeat __________ times. Complete this exercise __________ times a day. Exercise B: Soleus stretch, standing 1. Stand with your hands against a wall. 2. Extend your left / right leg behind you, and bend your front knee slightly. Both of your heels should be on the floor. 3. Keeping your heels on the floor, bend your back knee and shift your weight slightly over your back leg. You should feel a gentle stretch deep in your calf. 4. Hold this position for __________ seconds. Repeat __________ times. Complete this exercise __________ times a day. Strengthening exercises These exercises build strength and endurance in your lower leg.  Endurance is the ability to use your muscles for a long time, even after they get tired. Exercise C: Heel walking ( dorsiflexion) Walk on your heels for __________ seconds or ___________ ft. Keep your toes as high as possible. Repeat __________ times. Complete this exercise __________ times a day. Balance exercises These exercises improve your balance and the reaction and control of your ankle to help improve stability. Exercise D: Multi-angle lunge 6. Stand with your feet together. 7. Take a step forward with your left / right leg, and shift your weight onto that leg. Your back heel will come off the floor, and your back toes will stay in place. 8. Push off your front leg to return your front foot to the starting position next to your other foot. 9. Repeat to the side, to the back, and any other directions as told by your health care provider. Repeat in each direction __________ times. Complete this exercise __________ times a  day. Exercise E: Single leg stand 1. Without shoes, stand near a railing or in a door frame. Hold onto the railing or door frame as needed. 2. Stand on your left / right foot. Keep your big toe down on the floor and try to keep your arch lifted. 3. Hold this position for __________ seconds. Repeat __________ times. Complete this exercise __________ times a day. If this exercise is too easy, you can try it with your eyes closed or while standing on a pillow. Exercise F: Inversion/eversion  You will need a balance board for this exercise. Ask your health care provider where you can get a balance board or how you can make one. 6. Stand on a non-carpeted surface near a countertop or wall. 7. Step onto the balance board so your feet are hip-width apart. 8. Keep your feet in place and keep your upper body and hips steady. Using only your feet and ankles to move the board, do one or both of the following exercises as told by your health care provider: ? Tip the board side to side as far as you can, alternating between tipping to the left and tipping to the right. If you can, tip the board so it silently taps the floor. Do not let the board forcefully hit the floor. From time to time, pause to hold a steady position. ? Tip the board side to side so the board does not hit the floor at all. From time to time, pause to hold a steady position. Repeat the movement for each exercise __________ times. Complete each exercise __________ times a day. Exercise G: Plantar flexion/dorsiflexion  You will need a balance board for this exercise. Ask your health care provider where you can get a balance board or how you can make one. 1. Stand on a non-carpeted surface near a countertop or wall. 2. Step onto the balance board so your feet are hip-width apart. 3. Keep your feet in place and keep your upper body and hips steady. Using only your feet and ankles to move the board, do one or both of the following exercises as  told by your health care provider: ? Tip the board forward and backward so the board silently taps the floor. Do not let the board forcefully hit the floor. From time to time, pause to hold a steady position. ? Tip the board forward and backward so the board does not hit the floor at all. From time to time, pause to hold a steady position. Repeat the movement for  each exercise __________ times. Complete each exercise __________ times a day. This information is not intended to replace advice given to you by your health care provider. Make sure you discuss any questions you have with your health care provider. Document Released: 04/10/2005 Document Revised: 08/26/2015 Document Reviewed: 11/02/2014 Elsevier Interactive Patient Education  2018 [employer].

## 2016-10-09 ENCOUNTER — Encounter: Payer: Self-pay | Admitting: Family Medicine

## 2016-10-09 ENCOUNTER — Ambulatory Visit (INDEPENDENT_AMBULATORY_CARE_PROVIDER_SITE_OTHER): Payer: BLUE CROSS/BLUE SHIELD | Admitting: Family Medicine

## 2016-10-09 VITALS — BP 114/78 | HR 90 | Temp 98.1°F | Ht 64.0 in | Wt 198.0 lb

## 2016-10-09 DIAGNOSIS — M5412 Radiculopathy, cervical region: Secondary | ICD-10-CM

## 2016-10-09 MED ORDER — MELOXICAM 15 MG PO TABS: 15.0000 mg | ORAL_TABLET | Freq: Every day | ORAL | 0 refills | Status: DC

## 2016-10-09 MED ORDER — PREDNISONE 5 MG PO TABS: ORAL_TABLET | ORAL | 0 refills | Status: DC

## 2016-10-09 NOTE — Patient Instructions (Signed)
Thank you for coming in,   Please take the steroid and then take the meloxicam after the steroid.  Please follow-up with me in 3-4 weeks if there is no improvement. The next thing to consider would be physical therapy.   Please feel free to call with any questions or concerns at any time, at [phone]. --Dr. Raeford Razor

## 2016-10-09 NOTE — Progress Notes (Signed)
Melanie Ewing - 61 y.o. female MRN [id number]  Date of birth: 15-Apr-1955  SUBJECTIVE:  Including CC & ROS.  Chief Complaint  Patient presents with  . Arm Pain    Patient is here today C/O right arm pain.  It started with a cramp in her neck about 3 weeks ago and then arm started hurting.  She has had a pinched nerve before and expresses that this feels similar.  When she gets around cold air it worsens. When she tilts her head back it radiates almost to elbow.    Melanie Ewing is a 61 year old female that is presenting with right arm pain. She reports the pain has been ongoing for 3-4 weeks now. She is notices the pain in the posterior aspect of herupper arm. It radiates to the mid forearm. The pain is pulling in nature. She denies any inciting event. She has not tried any medications for this. The pain is worse when she does certain movements. Holding her arm above her head seems to improve the pain. She denies any numbness.   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  Musculoskeletal: Negative for gait problem.  Skin: Negative for color change.  Neurological: Negative for weakness and numbness.    HISTORY: Past Medical, Surgical, Social, and Family History Reviewed & Updated per EMR.   Pertinent Historical Findings include:  Past Medical History:  Diagnosis Date  . Arthritis     No past surgical history on file.  No Known Allergies  Family History  Problem Relation Age of Onset  . Arthritis Other   . Diabetes Other   . Hyperlipidemia Other   . Hypertension Other   . Stroke Other   . Alcohol abuse Mother   . Heart disease Father   . Heart disease Sister     . Diabetes Sister   . Cancer Neg Hx   . COPD Neg Hx   . Kidney disease Neg Hx      Social History   Social History  . Marital status: Single    Spouse name: N/A  . Number of children: N/A  . Years of education: N/A   Occupational History  . CNA     [employer]   Social History Main Topics  . Smoking status: Former Smoker    Packs/day: 0.50    Types: Cigarettes    Quit date: 03/09/2012  . Smokeless tobacco: Never Used  . Alcohol use No     Comment: once a month  . Drug use: No  . Sexual activity: Not Currently   Other Topics Concern  . Not on file   Social History Narrative  . No narrative on file     PHYSICAL EXAM:  VS: BP 114/78 (BP Location: Left Arm, Patient Position: Sitting, Cuff Size: Normal)   Pulse 90   Temp 98.1 F (36.7 C) (Oral)   Ht 5\' 4"  (1.626 m)   Wt 198 lb (89.8 kg)   SpO2 97%   BMI 33.99 kg/m  Physical Exam Gen: NAD, alert, cooperative with exam, well-appearing ENT: normal lips, normal nasal mucosa,  Eye: normal EOM, normal conjunctiva and lids CV:  no edema, +2 pedal pulses   Resp: no accessory muscle use, non-labored,  Skin: no rashes, no areas of induration  Neuro: normal tone, normal sensation to touch Psych:  normal insight, alert and oriented MSK:  Neck: Normal range of motion. Normal shrug strength resistance. Exacerbation of pain with extension. Positive Spurling's on the right. Normal strength in upper extremities. Normal sensation upper extremity  Normal pincer grasp  Normal grip strength. Neurovascular intact      ASSESSMENT & PLAN:   Cervical radiculopathy This is acute in nature.It appears in her chart review that she has had similar symptoms.oes not appear to be coming from her shoulder - Prednisone initially And then she can take meloxicam - if no improvement advised to follow-up a can consider physical therapy. Could consider a soft collar.Could get updated plain films

## 2016-10-09 NOTE — Assessment & Plan Note (Signed)
This is acute in nature.It appears in her chart review that she has had similar symptoms.oes not appear to be coming from her shoulder - Prednisone initially And then she can take meloxicam - if no improvement advised to follow-up a can consider physical therapy. Could consider a soft collar.Could get updated plain films

## 2016-11-04 ENCOUNTER — Other Ambulatory Visit: Payer: Self-pay | Admitting: Family Medicine

## 2016-12-14 ENCOUNTER — Other Ambulatory Visit: Payer: Self-pay | Admitting: Family Medicine

## 2016-12-27 ENCOUNTER — Ambulatory Visit: Payer: BLUE CROSS/BLUE SHIELD | Admitting: Internal Medicine

## 2017-01-04 ENCOUNTER — Ambulatory Visit: Payer: BLUE CROSS/BLUE SHIELD | Admitting: Internal Medicine

## 2017-01-04 ENCOUNTER — Ambulatory Visit (INDEPENDENT_AMBULATORY_CARE_PROVIDER_SITE_OTHER)
Admission: RE | Admit: 2017-01-04 | Discharge: 2017-01-04 | Disposition: A | Discharge status: Home / Self Care | Payer: BLUE CROSS/BLUE SHIELD | Source: Ambulatory Visit | Attending: Internal Medicine | Admitting: Internal Medicine

## 2017-01-04 ENCOUNTER — Telehealth: Payer: Self-pay | Admitting: Internal Medicine

## 2017-01-04 ENCOUNTER — Encounter: Payer: Self-pay | Admitting: Internal Medicine

## 2017-01-04 VITALS — BP 118/78 | HR 72 | Temp 98.8°F | Resp 16 | Ht 64.0 in | Wt 201.1 lb

## 2017-01-04 DIAGNOSIS — M5412 Radiculopathy, cervical region: Secondary | ICD-10-CM | POA: Diagnosis not present

## 2017-01-04 DIAGNOSIS — R062 Wheezing: Secondary | ICD-10-CM

## 2017-01-04 LAB — POCT EXHALED NITRIC OXIDE: FENO LEVEL (PPB): 19

## 2017-01-04 MED ORDER — TRAMADOL HCL 50 MG PO TABS
50.0000 mg | ORAL_TABLET | Freq: Four times a day (QID) | ORAL | 2 refills | Status: DC | PRN
Start: 2017-01-04 — End: 2017-12-13

## 2017-01-04 MED ORDER — MELOXICAM 15 MG PO TABS: 15.0000 mg | ORAL_TABLET | Freq: Every day | ORAL | 1 refills | Status: DC

## 2017-01-04 MED ORDER — UMECLIDINIUM-VILANTEROL 62.5-25 MCG/INH IN AEPB
1.0000 | INHALATION_SPRAY | Freq: Every day | RESPIRATORY_TRACT | 1 refills | Status: DC
Start: 2017-01-04 — End: 2019-02-05

## 2017-01-04 NOTE — Patient Instructions (Signed)
Bronchospasm, Adult Bronchospasm is a tightening of the airways going into the lungs. During an episode, it may be harder to breathe. You may cough, and you may make a whistling sound when you breathe (wheeze). This condition often affects people with asthma. What are the causes? This condition is caused by swelling and irritation in the airways. It can be triggered by:  An infection (common).  Seasonal allergies.  An allergic reaction.  Exercise.  Irritants. These include pollution, cigarette smoke, strong odors, aerosol sprays, and paint fumes.  Weather changes. Winds increase molds and pollens in the air. Cold air may cause swelling.  Stress and emotional upset.  What are the signs or symptoms? Symptoms of this condition include:  Wheezing. If the episode was triggered by an allergy, wheezing may start right away or hours later.  Nighttime coughing.  Frequent or severe coughing with a simple cold.  Chest tightness.  Shortness of breath.  Decreased ability to exercise.  How is this diagnosed? This condition is usually diagnosed with a review of your medical history and a physical exam. Tests, such as lung function tests, are sometimes done to look for other conditions. The need for a chest X-ray depends on where the wheezing occurs and whether it is the first time you have wheezed. How is this treated? This condition may be treated with:  Inhaled medicines. These open up the airways and help you breathe. They can be taken with an inhaler or a nebulizer device.  Corticosteroid medicines. These may be given for severe bronchospasm, usually when it is associated with asthma.  Avoiding triggers, such as irritants, infection, or allergies.  Follow these instructions at home: Medicines  Take over-the-counter and prescription medicines only as told by your health care provider.  If you need to use an inhaler or nebulizer to take your medicine, ask your health care  provider to explain how to use it correctly. If you were given a spacer, always use it with your inhaler. Lifestyle  Reduce the number of triggers in your home. To do this: ? Change your heating and air conditioning filter at least once a month. ? Limit your use of fireplaces and wood stoves. ? Do not smoke. Do not allow smoking in your home. ? Avoid using perfumes and fragrances. ? Get rid of pests, such as roaches and mice, and their droppings. ? Remove any mold from your home. ? Keep your house clean and dust free. Use unscented cleaning products. ? Replace carpet with wood, tile, or vinyl flooring. Carpet can trap dander and dust. ? Use allergy-proof pillows, mattress covers, and box spring covers. ? Wash bed sheets and blankets every week in hot water. Dry them in a dryer. ? Use blankets that are made of polyester or cotton. ? Wash your hands often. ? Do not allow pets in your bedroom.  Avoid breathing in cold air when you exercise. General instructions  Have a plan for seeking medical care. Know when to call your health care provider and local emergency services, and where to get emergency care.  Stay up to date on your immunizations.  When you have an episode of bronchospasm, stay calm. Try to relax and breathe more slowly.  If you have asthma, make sure you have an asthma action plan.  Keep all follow-up visits as told by your health care provider. This is important. Contact a health care provider if:  You have muscle aches.  You have chest pain.  The mucus that you   cough up (sputum) changes from clear or white to yellow, green, gray, or bloody.  You have a fever.  Your sputum gets thicker. Get help right away if:  Your wheezing and coughing get worse, even after you take your prescribed medicines.  It gets even harder to breathe.  You develop severe chest pain. Summary  Bronchospasm is a tightening of the airways going into the lungs.  During an episode of  bronchospasm, you may have a harder time breathing. You may cough and make a whistling sound when you breathe (wheeze).  Avoid exposure to triggers such as smoke, dust, mold, animal dander, and fragrances.  When you have an episode of bronchospasm, stay calm. Try to relax and breathe more slowly. This information is not intended to replace advice given to you by your health care provider. Make sure you discuss any questions you have with your health care provider. Document Released: 12/22/2002 Document Revised: 12/16/2015 Document Reviewed: 12/16/2015 Elsevier Interactive Patient Education  2017 [employer]  

## 2017-01-04 NOTE — Progress Notes (Signed)
Subjective:  Patient ID: Melanie Ewing, female    DOB: 11/17/1955  Age: 62 y.o. MRN: [id number]  CC: Neck Pain   HPI Arella Blinder Vidovich presents for several concerns.  She complains of a several month history of intermittent right-sided neck pain that radiates into her right upper extremity.  She has tried meloxicam which has provided modest symptom relief.  She denies any recent trauma or injury.  She denies numbness/weakness/tingling in her arms or legs.  She does repetitive activities at work that exacerbate the pain.  She also complains of intermittent episodes of wheezing without cough, shortness of breath, or hemoptysis.  She has a remote history of tobacco abuse.  Outpatient Medications Prior to Visit  Medication Sig Dispense Refill  . fish oil-omega-3 fatty acids 1000 MG capsule Take 1 g by mouth daily.    Marland Kitchen linaclotide (LINZESS) 145 MCG CAPS capsule Take 1 capsule (145 mcg total) by mouth daily before breakfast. 90 capsule 3  . Multiple Vitamin (MULTIVITAMIN) tablet Take 1 tablet by mouth daily.    . Ibuprofen-Famotidine 800-26.6 MG TABS Take 1 tablet by mouth 3 (three) times daily as needed. 90 tablet 1  . meloxicam (MOBIC) 15 MG tablet Take 1 tablet (15 mg total) by mouth daily. 30 tablet 0  . predniSONE (DELTASONE) 5 MG tablet Take 6 pills for first day, 5 pills second day, 4 pills third day, 3 pills fourth day, 2 pills the fifth day, and 1 pill sixth day. 21 tablet 0   No facility-administered medications prior to visit.     ROS Review of Systems  Constitutional: Negative.  Negative for appetite change, diaphoresis, fatigue and unexpected weight change.  HENT: Negative.  Negative for sore throat and trouble swallowing.   Eyes: Negative.   Respiratory: Positive for wheezing. Negative for cough, chest tightness, shortness of breath and stridor.   Cardiovascular: Negative for chest pain, palpitations and leg swelling.  Gastrointestinal: Negative for abdominal pain,  diarrhea and nausea.  Endocrine: Negative.   Genitourinary: Negative.   Musculoskeletal: Positive for neck pain. Negative for arthralgias, back pain and neck stiffness.  Skin: Negative.   Allergic/Immunologic: Negative.   Neurological: Negative.  Negative for dizziness, weakness, numbness and headaches.  Hematological: Negative for adenopathy. Does not bruise/bleed easily.  Psychiatric/Behavioral: Negative.     Objective:  BP 118/78 (BP Location: Right Arm, Patient Position: Sitting, Cuff Size: Normal)   Pulse 72   Temp 98.8 F (37.1 C) (Oral)   Resp 16   Ht 5\' 4"  (1.626 m)   Wt 201 lb 1.9 oz (91.2 kg)   SpO2 98%   BMI 34.52 kg/m   BP Readings from Last 3 Encounters:  01/04/17 118/78  10/09/16 114/78  06/01/16 118/80    Wt Readings from Last 3 Encounters:  01/04/17 201 lb 1.9 oz (91.2 kg)  10/09/16 198 lb (89.8 kg)  06/01/16 201 lb 12.8 oz (91.5 kg)    Physical Exam  Constitutional: No distress.  HENT:  Mouth/Throat: Oropharynx is clear and moist. No oropharyngeal exudate.  Eyes: Conjunctivae are normal. Left eye exhibits no discharge. No scleral icterus.  Neck: Normal range of motion. Neck supple. No JVD present. No thyromegaly present.  Cardiovascular: Normal rate, regular rhythm and normal heart sounds.  No murmur heard. Pulmonary/Chest: Effort normal and breath sounds normal. No tachypnea. No respiratory distress. She has no decreased breath sounds. She has no wheezes. She has no rhonchi. She has no rales.  Abdominal: Soft. Bowel sounds are normal.  She exhibits no distension. There is no rebound and no guarding.  Musculoskeletal: Normal range of motion. She exhibits no edema, tenderness or deformity.       Cervical back: Normal. She exhibits normal range of motion, no tenderness, no bony tenderness, no swelling, no edema, no deformity and no spasm.  Lymphadenopathy:    She has no cervical adenopathy.  Neurological: She has normal strength. She displays no atrophy,  no tremor and normal reflexes. No cranial nerve deficit or sensory deficit. She exhibits normal muscle tone. She displays a negative Romberg sign. She displays no seizure activity. Coordination and gait normal. She displays no Babinski's sign on the right side.  Reflex Scores:      Tricep reflexes are 0 on the right side and 0 on the left side.      Bicep reflexes are 0 on the right side and 0 on the left side.      Brachioradialis reflexes are 0 on the right side and 0 on the left side.      Patellar reflexes are 0 on the right side and 0 on the left side.      Achilles reflexes are 0 on the right side and 0 on the left side. Skin: Skin is warm and dry. No rash noted. She is not diaphoretic. No erythema. No pallor.  Vitals reviewed.   Lab Results  Component Value Date   WBC 9.2 01/17/2016   HGB 13.4 01/17/2016   HCT 40.4 01/17/2016   PLT 480.0 (H) 01/17/2016   GLUCOSE 95 01/17/2016   CHOL 178 01/17/2016   TRIG 123.0 01/17/2016   HDL 55.30 01/17/2016   LDLCALC 99 01/17/2016   ALT 15 01/17/2016   AST 15 01/17/2016   NA 141 01/17/2016   K 4.0 01/17/2016   CL 107 01/17/2016   CREATININE 0.86 01/17/2016   BUN 14 01/17/2016   CO2 28 01/17/2016   TSH 1.96 01/17/2016   HGBA1C 6.1 01/17/2016   MICROALBUR 0.8 10/05/2014    Dg Ankle Complete Left  Result Date: 04/18/2016 CLINICAL DATA:  62 year old female status post twisting injury last night with lateral pain and swelling. Initial encounter. EXAM: LEFT ANKLE COMPLETE - 3+ VIEW COMPARISON:  Left ankle series 08/28/11. FINDINGS: Mortise joint alignment is preserved. Talar dome intact. No ankle joint effusion. Mostly anterior soft tissue swelling and stranding is evident. Mild anterior tibia and talar degenerative spurring noted. Chronic but increased degenerative spurring at the lateral malleolus. No acute fracture of the distal tibia or fibula identified. Calcaneus intact with mild degenerative spurring. No acute osseous abnormality  identified. IMPRESSION: Degenerative osteophytosis about the left ankle. No acute fracture or dislocation identified. Electronically Signed   By: Genevie Ann M.D.   On: 04/18/2016 07:49    Assessment & Plan:   Chane was seen today for neck pain.  Diagnoses and all orders for this visit:  Wheezing- She does not have an elevated FeNO score so I do not think her symptoms are related to asthma, allergic, or eosinophilic causes. I am concerned she may have developed COPD or chronic bronchitis so I have asked her to undergo PFTs.  For now, will start treating her with a LAMA/LABA combination inhaler.  I gave her a sample of ANORO and showed her how to use it.  She demonstrated proficiency with its use. -     POCT EXHALED NITRIC OXIDE -     Pulmonary Function Test; Future -     umeclidinium-vilanterol (ANORO ELLIPTA) 62.5-25  MCG/INH AEPB; Inhale 1 puff into the lungs daily.  Radiculitis of right cervical region- Her symptoms are consistent with radiculopathy but she is neurologically intact.  Plain film shows minimal DDD.  Will continue meloxicam for symptom relief, will add tramadol for additional symptom relief.  If she continues to have symptoms then I will recommend that she either undergo an additional diagnostic image such as an MRI or to see a pain specialist to consider epidural steroid injections. -     DG Cervical Spine Complete; Future -     meloxicam (MOBIC) 15 MG tablet; Take 1 tablet (15 mg total) by mouth daily. -     traMADol (ULTRAM) 50 MG tablet; Take 1 tablet (50 mg total) by mouth every 6 (six) hours as needed.   I have discontinued Jeffifer A. Patchen's Ibuprofen-Famotidine and predniSONE. I am also having her start on umeclidinium-vilanterol and traMADol. Additionally, I am having her maintain her fish oil-omega-3 fatty acids, linaclotide, multivitamin, and meloxicam.  Meds ordered this encounter  Medications  . umeclidinium-vilanterol (ANORO ELLIPTA) 62.5-25 MCG/INH AEPB     Sig: Inhale 1 puff into the lungs daily.    Dispense:  90 each    Refill:  1  . meloxicam (MOBIC) 15 MG tablet    Sig: Take 1 tablet (15 mg total) by mouth daily.    Dispense:  30 tablet    Refill:  1  . traMADol (ULTRAM) 50 MG tablet    Sig: Take 1 tablet (50 mg total) by mouth every 6 (six) hours as needed.    Dispense:  75 tablet    Refill:  2     Follow-up: No Follow-up on file.  Scarlette Calico, MD

## 2017-01-04 NOTE — Telephone Encounter (Signed)
Done

## 2017-01-04 NOTE — Telephone Encounter (Signed)
Copied from [place] [id number]. Topic: Quick Communication - Rx Refill/Question >> Jan 04, 2017  4:28 PM Cecelia Byars, NT wrote: Reason for CRM: Patient was seen today and was told she given a script for a new  pain medication , not another inhaler,  please call  [phone]

## 2017-01-04 NOTE — Telephone Encounter (Signed)
Pain medication requested. Please advise

## 2017-01-05 ENCOUNTER — Encounter: Payer: Self-pay | Admitting: Internal Medicine

## 2017-01-05 NOTE — Telephone Encounter (Signed)
Pt informed rx has been sent in.  

## 2017-01-15 ENCOUNTER — Ambulatory Visit (INDEPENDENT_AMBULATORY_CARE_PROVIDER_SITE_OTHER): Payer: BLUE CROSS/BLUE SHIELD | Admitting: Internal Medicine

## 2017-01-15 DIAGNOSIS — R062 Wheezing: Secondary | ICD-10-CM | POA: Diagnosis not present

## 2017-01-15 LAB — PULMONARY FUNCTION TEST
DL/VA % PRED: 106 %
DL/VA: 5.11 ml/min/mmHg/L
DLCO COR % PRED: 75 %
DLCO COR: 18.28 ml/min/mmHg
DLCO UNC % PRED: 74 %
DLCO unc: 18.11 ml/min/mmHg
FEF 25-75 PRE: 2.46 L/s
FEF2575-%PRED-PRE: 117 %
FEV1-%Change-Post: -35 %
FEV1-%PRED-PRE: 82 %
FEV1-%Pred-Post: 53 %
FEV1-Post: 1.13 L
FEV1-Pre: 1.75 L
FEV1FVC-%CHANGE-POST: 0 %
FEV1FVC-%Pred-Pre: 109 %
FEV6-%CHANGE-POST: -35 %
FEV6-%PRED-POST: 49 %
FEV6-%PRED-PRE: 77 %
FEV6-PRE: 2 L
FEV6-Post: 1.28 L
FEV6FVC-%PRED-POST: 103 %
FEV6FVC-%PRED-PRE: 103 %
FVC-%Change-Post: -35 %
FVC-%PRED-POST: 47 %
FVC-%Pred-Pre: 74 %
FVC-Post: 1.28 L
FVC-Pre: 2 L
POST FEV6/FVC RATIO: 100 %
PRE FEV1/FVC RATIO: 88 %
Post FEV1/FVC ratio: 88 %
Pre FEV6/FVC Ratio: 100 %
RV % pred: 84 %
RV: 1.66 L
TLC % pred: 78 %
TLC: 3.96 L

## 2017-01-15 NOTE — Progress Notes (Signed)
PFT completed today 01/15/17  

## 2017-01-18 ENCOUNTER — Encounter: Payer: Self-pay | Admitting: Internal Medicine

## 2017-01-18 ENCOUNTER — Other Ambulatory Visit: Payer: Self-pay | Admitting: Internal Medicine

## 2017-01-18 DIAGNOSIS — R062 Wheezing: Secondary | ICD-10-CM

## 2017-02-01 ENCOUNTER — Institutional Professional Consult (permissible substitution): Payer: BLUE CROSS/BLUE SHIELD | Admitting: Internal Medicine

## 2017-04-02 ENCOUNTER — Other Ambulatory Visit: Payer: Self-pay

## 2017-04-02 ENCOUNTER — Encounter (HOSPITAL_BASED_OUTPATIENT_CLINIC_OR_DEPARTMENT_OTHER): Payer: Self-pay

## 2017-04-02 ENCOUNTER — Emergency Department (HOSPITAL_BASED_OUTPATIENT_CLINIC_OR_DEPARTMENT_OTHER)
Admission: EM | Admit: 2017-04-02 | Discharge: 2017-04-02 | Disposition: A | Discharge status: Home / Self Care | Payer: BLUE CROSS/BLUE SHIELD | Attending: Emergency Medicine | Admitting: Emergency Medicine

## 2017-04-02 DIAGNOSIS — Z87891 Personal history of nicotine dependence: Secondary | ICD-10-CM | POA: Insufficient documentation

## 2017-04-02 DIAGNOSIS — R69 Illness, unspecified: Secondary | ICD-10-CM

## 2017-04-02 DIAGNOSIS — J111 Influenza due to unidentified influenza virus with other respiratory manifestations: Secondary | ICD-10-CM | POA: Diagnosis not present

## 2017-04-02 DIAGNOSIS — R05 Cough: Secondary | ICD-10-CM | POA: Diagnosis present

## 2017-04-02 MED ORDER — BENZONATATE 100 MG PO CAPS: 100.0000 mg | ORAL_CAPSULE | Freq: Three times a day (TID) | ORAL | 0 refills | Status: DC

## 2017-04-02 MED ORDER — FLUTICASONE PROPIONATE 50 MCG/ACT NA SUSP: 2.0000 | Freq: Every day | NASAL | 0 refills | Status: DC

## 2017-04-02 NOTE — ED Provider Notes (Signed)
Emergency Department Provider Note   I have reviewed the triage vital signs and the nursing notes.   HISTORY  Chief Complaint Cough   HPI Melanie Ewing is a 62 y.o. female with PMH of arthritis presents to the emergency department for evaluation of headache, sore throat, body aches, subjective fevers, and runny nose.  Symptoms began today.  She states her grandson has similar symptoms at home.  Denies any vomiting or diarrhea.  No specific chest pain.  No vision changes.  No radiation of symptoms or modifying factors.  He has not taken any medications prior to ED presentation.  Denies significant cough or shortness of breath at this time. She notes ear pain as well that is worse on the right with no modifying factors or radiation.    Past Medical History:  Diagnosis Date  . Arthritis     Patient Active Problem List   Diagnosis Date Noted  . Wheezing 01/04/2017  . Moderate left ankle sprain 06/01/2016  . Fibroid uterus 02/09/2016  . Pain of left hip joint 02/08/2016  . 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 Left hand pain 01/31/2016  . Cervical cancer screening 01/17/2016  . Deformity, hand, claw, left 01/17/2016  . Radiculitis of right cervical region 02/10/2013  . Other abnormal glucose 12/09/2012  . 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 Constipation 12/09/2012  . Routine general medical examination at a health care facility 05/20/2012  . Visit for screening mammogram 05/20/2012    History reviewed. No pertinent surgical history.  Current Outpatient Rx  . Order #: [id number] Class: Print  . Order #: [id number] Class: Historical Med  . Order #: [id number] Class: Print  . Order #: [id number] Class: Normal  . Order #: [id number] Class: Normal  . Order #: [id number] Class: Historical Med  . Order #: [id number] Class: Normal  . Order #: [id number] Class: Normal    Allergies Patient has no known allergies.  Family History  Problem  Relation Age of Onset  . Arthritis Other   . Diabetes Other   . Hyperlipidemia Other   . Hypertension Other   . Stroke Other   . Alcohol abuse Mother   . Heart disease Father   . Heart disease Sister   . Diabetes Sister   . Cancer Neg Hx   . COPD Neg Hx   . Kidney disease Neg Hx     Social History Social History   Tobacco Use  . Smoking status: Former Smoker    Packs/day: 0.50    Types: Cigarettes    Last attempt to quit: 03/09/2012    Years since quitting: 5.0  . Smokeless tobacco: Never Used  Substance Use Topics  . Alcohol use: No  . Drug use: No    Review of Systems  Constitutional: Positive subjective fever. Positive body aches.  Eyes: No visual changes. ENT: Positive sore throat and earache.  Cardiovascular: Denies chest pain. Respiratory: Denies shortness of breath. Gastrointestinal: No abdominal pain.  No nausea, no vomiting.  No diarrhea.  No constipation. Genitourinary: Negative for dysuria. Musculoskeletal: Negative for back pain. Skin: Negative for rash. Neurological: Negative for focal weakness or numbness. Positive HA.   10-point ROS otherwise negative.  ____________________________________________   PHYSICAL EXAM:  VITAL SIGNS: ED Triage Vitals  Enc Vitals Group     BP 04/02/17 1939 (!) 122/58     Pulse Rate 04/02/17 1939 92     Resp 04/02/17 1939 20     Temp 04/02/17 1939 99.5 F (37.5 C)  Temp Source 04/02/17 1939 Oral     SpO2 04/02/17 1939 97 %     Weight 04/02/17 1939 194 lb 7.1 oz (88.2 kg)     Height 04/02/17 1939 5' 4"  (1.626 m)     Pain Score 04/02/17 1937 5   Constitutional: Alert and oriented. Well appearing and in no acute distress. Eyes: Conjunctivae are normal.  Head: Atraumatic. Ears:  Healthy appearing ear canals and TMs bilaterally Nose: Positive mild congestion/rhinnorhea. Mouth/Throat: Mucous membranes are moist.  Oropharynx with mild erythema but no exudate or PTA.  Neck: No stridor.   Cardiovascular: Normal  rate, regular rhythm. Good peripheral circulation. Grossly normal heart sounds.   Respiratory: Normal respiratory effort.  No retractions. Lungs CTAB. Gastrointestinal: Soft and nontender. No distention.  Musculoskeletal: No lower extremity tenderness nor edema. No gross deformities of extremities. Neurologic:  Normal speech and language. No gross focal neurologic deficits are appreciated.  Skin:  Skin is warm, dry and intact. No rash noted.  ____________________________________________  RADIOLOGY  None ____________________________________________   PROCEDURES  Procedure(s) performed:   Procedures  None ____________________________________________   INITIAL IMPRESSION / ASSESSMENT AND PLAN / ED COURSE  Pertinent labs & imaging results that were available during my care of the patient were reviewed by me and considered in my medical decision making (see chart for details).  Presents to the emergency department for evaluation of upper respiratory tract infection symptoms.  Symptoms are consistent with possible influenza.  The patient has no hypoxia.  Normal lung exam.  Symptoms began today.  No indication for chest x-ray at this time.  I offered Tamiflu and discussed the risks and benefits of this medication and the patient has opted for supportive care at home.  She states she is already had the flu this year.  Advised strict return precautions to the emergency department and PCP follow-up as needed.  At this time, I do not feel there is any life-threatening condition present. I have reviewed and discussed all results (EKG, imaging, lab, urine as appropriate), exam findings with patient. I have reviewed nursing notes and appropriate previous records.  I feel the patient is safe to be discharged home without further emergent workup. Discussed usual and customary return precautions. Patient and family (if present) verbalize understanding and are comfortable with this plan.  Patient will  follow-up with their primary care provider. If they do not have a primary care provider, information for follow-up has been provided to them. All questions have been answered.  ____________________________________________  FINAL CLINICAL IMPRESSION(S) / ED DIAGNOSES  Final diagnoses:  Influenza-like illness    NEW OUTPATIENT MEDICATIONS STARTED DURING THIS VISIT:  Discharge Medication List as of 04/02/2017  8:11 PM    START taking these medications   Details  benzonatate (TESSALON) 100 MG capsule Take 1 capsule (100 mg total) by mouth every 8 (eight) hours., Starting Mon 04/02/2017, Print    fluticasone (FLONASE) 50 MCG/ACT nasal spray Place 2 sprays into both nostrils daily for 7 days., Starting Mon 04/02/2017, Until Mon 04/09/2017, Print        Note:  This document was prepared using Dragon voice recognition software and may include unintentional dictation errors.  Nanda Quinton, MD Emergency Medicine    Anil Havard, [facility], MD 04/03/17 [phone]

## 2017-04-02 NOTE — ED Triage Notes (Signed)
C/o flu like sx x today-NAD-steady gait

## 2017-04-02 NOTE — Discharge Instructions (Signed)

## 2017-04-02 NOTE — ED Notes (Signed)
Pt verbalizes understanding of d/c instructions and denies any further needs at this time. 

## 2017-05-03 ENCOUNTER — Institutional Professional Consult (permissible substitution): Payer: BLUE CROSS/BLUE SHIELD | Admitting: Internal Medicine

## 2017-05-17 ENCOUNTER — Telehealth: Payer: Self-pay | Admitting: Internal Medicine

## 2017-05-17 NOTE — Telephone Encounter (Signed)
Copied from [place] [id number]. Topic: Quick Communication - Rx Refill/Question >> May 17, 2017  1:22 PM Bea Graff, NT wrote: Medication: Duexis Has the patient contacted their pharmacy? Yes.   (Agent: If no, request that the patient contact the pharmacy for the refill.) Preferred Pharmacy (with phone number or street name): [facility], [place], [place] - [address] [phone] (Phone) [phone] (Fax)     Agent: Please be advised that RX refills may take up to 3 business days. We ask that you follow-up with your pharmacy.

## 2017-05-17 NOTE — Telephone Encounter (Signed)
Left pt. Message that Duexis is not on medication list.

## 2017-05-24 ENCOUNTER — Telehealth: Payer: Self-pay | Admitting: Internal Medicine

## 2017-05-24 NOTE — Telephone Encounter (Signed)
Copied from [place] [phone]. Topic: Inquiry >> May 24, 2017  3:35 PM Melanie Ewing wrote: Reason for CRM: Patient called regarding the message nurse Melanie Ewing sent on 05/17/2017. Patient stated that the Duexis medication was prescribed by Melanie Ewing, as Dr. Ronnald Ramp was not in the office on that particular day. Patient states that she still needs this medication refilled.       Thank You!!!

## 2017-05-24 NOTE — Telephone Encounter (Signed)
Pt requesting refill of Duexis. Medication not on current medication list and pt states it was previously filled by [facility].   LOV: 01/04/17 [facility] in [place]

## 2017-05-25 ENCOUNTER — Other Ambulatory Visit: Payer: Self-pay | Admitting: Internal Medicine

## 2017-05-25 DIAGNOSIS — M8949 Other hypertrophic osteoarthropathy, multiple sites: Secondary | ICD-10-CM

## 2017-05-25 DIAGNOSIS — M159 Polyosteoarthritis, unspecified: Secondary | ICD-10-CM

## 2017-05-25 DIAGNOSIS — M170 Bilateral primary osteoarthritis of knee: Secondary | ICD-10-CM

## 2017-05-25 DIAGNOSIS — M5412 Radiculopathy, cervical region: Secondary | ICD-10-CM

## 2017-05-25 DIAGNOSIS — M150 Primary generalized (osteo)arthritis: Secondary | ICD-10-CM

## 2017-05-25 MED ORDER — IBUPROFEN-FAMOTIDINE 800-26.6 MG PO TABS: 1.0000 | ORAL_TABLET | Freq: Three times a day (TID) | ORAL | 0 refills | Status: DC | PRN

## 2017-05-25 NOTE — Telephone Encounter (Signed)
Okay to fill Duexis?  Rq to fill at [facility] if approved.

## 2017-06-01 ENCOUNTER — Ambulatory Visit (HOSPITAL_COMMUNITY)
Admission: EM | Admit: 2017-06-01 | Discharge: 2017-06-01 | Disposition: A | Discharge status: Home / Self Care | Payer: BLUE CROSS/BLUE SHIELD | Attending: Family Medicine | Admitting: Family Medicine

## 2017-06-01 ENCOUNTER — Encounter (HOSPITAL_COMMUNITY): Payer: Self-pay

## 2017-06-01 DIAGNOSIS — H9203 Otalgia, bilateral: Secondary | ICD-10-CM | POA: Diagnosis not present

## 2017-06-01 DIAGNOSIS — K1120 Sialoadenitis, unspecified: Secondary | ICD-10-CM

## 2017-06-01 NOTE — ED Triage Notes (Signed)
Pt presents with complaints of bilateral ear pain for "a long time".

## 2017-06-01 NOTE — Discharge Instructions (Signed)
Please take ibuprofen and tylenol, use sour drps even if pain ful to help expel possible stones  Follow up with ENT

## 2017-06-02 NOTE — ED Provider Notes (Signed)
[place]    CSN: [id number] Arrival date & time: 06/01/17  1019     History   Chief Complaint Chief Complaint  Patient presents with  . Otalgia    HPI Melanie Ewing is a 62 y.o. female no contributing past medical history presenting today for evaluation of bilateral ear pain.  Patient states that she has had pain off and on for years.  She has been expensing by her PCP multiple times and he has been unable to find anything.  She has not seen a specialist.  She endorses an aching sensation on bilateral areas anterior to ear.  Does feel like pain worsens when she eats something sweet or sour.  Has been taking ibuprofen without relief.  Denies any changes in vision.  Denies any congestion, sore throat or cough.  Denies fevers.  HPI  Past Medical History:  Diagnosis Date  . Arthritis     Patient Active Problem List   Diagnosis Date Noted  . Fibroid uterus 02/09/2016  . 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 Cervical cancer screening 01/17/2016  . Deformity, hand, claw, left 01/17/2016  . Radiculitis of right cervical region 02/10/2013  . Other abnormal glucose 12/09/2012  . 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 Constipation 12/09/2012  . Routine general medical examination at a health care facility 05/20/2012  . Visit for screening mammogram 05/20/2012    History reviewed. No pertinent surgical history.  OB History    Gravida  4   Para  3   Term      Preterm      AB  1   Living  3     SAB  1   TAB      Ectopic      Multiple      Live Births               Home Medications    Prior to Admission medications   Medication Sig Start Date End Date Taking? Authorizing Provider  Multiple Vitamin (MULTIVITAMIN) tablet Take 1 tablet by mouth daily.   Yes [provider]  benzonatate (TESSALON) 100 MG capsule Take 1 capsule (100 mg total) by mouth every 8 (eight) hours. 04/02/17   Long,  Wonda Olds, MD  fish oil-omega-3 fatty acids 1000 MG capsule Take 1 g by mouth daily.    [provider]  fluticasone (FLONASE) 50 MCG/ACT nasal spray Place 2 sprays into both nostrils daily for 7 days. 04/02/17 04/09/17  Long, Wonda Olds, MD  Ibuprofen-Famotidine (DUEXIS) 800-26.6 MG TABS Take 1 tablet by mouth 3 (three) times daily with meals as needed. 05/25/17   Janith Lima, MD  linaclotide [facility]) 145 MCG CAPS capsule Take 1 capsule (145 mcg total) by mouth daily before breakfast. 01/17/16   Janith Lima, MD  traMADol (ULTRAM) 50 MG tablet Take 1 tablet (50 mg total) by mouth every 6 (six) hours as needed. 01/04/17   Janith Lima, MD  umeclidinium-vilanterol (ANORO ELLIPTA) 62.5-25 MCG/INH AEPB Inhale 1 puff into the lungs daily. 01/04/17   Janith Lima, MD    Family History Family History  Problem Relation Age of Onset  . Arthritis Other   . Diabetes Other   . Hyperlipidemia Other   . Hypertension Other   . Stroke Other   . Alcohol abuse Mother   . Heart disease Father   . Heart disease Sister   . Diabetes Sister   . Cancer  Neg Hx   . COPD Neg Hx   . Kidney disease Neg Hx     Social History Social History   Tobacco Use  . Smoking status: Former Smoker    Packs/day: 0.50    Types: Cigarettes    Last attempt to quit: 03/09/2012    Years since quitting: 5.2  . Smokeless tobacco: Never Used  Substance Use Topics  . Alcohol use: No  . Drug use: No     Allergies   Patient has no known allergies.   Review of Systems Review of Systems  Constitutional: Negative for chills, fatigue and fever.  HENT: Positive for ear pain. Negative for congestion, rhinorrhea, sinus pressure, sore throat and trouble swallowing.   Eyes: Negative for photophobia, pain and visual disturbance.  Respiratory: Negative for cough, chest tightness and shortness of breath.   Cardiovascular: Negative for chest pain.  Gastrointestinal: Negative for abdominal pain, nausea and vomiting.    Musculoskeletal: Negative for myalgias.  Skin: Negative for rash.  Neurological: Negative for dizziness, light-headedness and headaches.     Physical Exam Triage Vital Signs ED Triage Vitals [06/01/17 1043]  Enc Vitals Group     BP 140/74     Pulse Rate 67     Resp 18     Temp 97.9 F (36.6 C)     Temp src      SpO2 97 %     Weight      Height      Head Circumference      Peak Flow      Pain Score 8     Pain Loc      Pain Edu?      Excl. in [place]?    No data found.  Updated Vital Signs BP 140/74   Pulse 67   Temp 97.9 F (36.6 C)   Resp 18   SpO2 97%   Visual Acuity Right Eye Distance:   Left Eye Distance:   Bilateral Distance:    Right Eye Near:   Left Eye Near:    Bilateral Near:     Physical Exam  Constitutional: She appears well-developed and well-nourished. No distress.  HENT:  Head: Normocephalic and atraumatic.  Mouth/Throat: Oropharynx is clear and moist.  Bilateral ears without tenderness to palpation of external auricle, tragus and mastoid, EAC's without erythema or swelling, TM's with good bony landmarks and cone of light. Non erythematous.  Palpation over bilateral parotid glands feels tense/tight.  Palpation reproduces pain.  Nontender to palpation to submandibular area bilaterally.  Eyes: Conjunctivae are normal.  Neck: Neck supple.  Cardiovascular: Normal rate and regular rhythm.  No murmur heard. Pulmonary/Chest: Effort normal and breath sounds normal. No respiratory distress.  Musculoskeletal: She exhibits no edema.  Neurological: She is alert.  Skin: Skin is warm and dry.  Psychiatric: She has a normal mood and affect.  Nursing note and vitals reviewed.    UC Treatments / Results  Labs (all labs ordered are listed, but only abnormal results are displayed) Labs Reviewed - No data to display  EKG None  Radiology No results found.  Procedures Procedures (including critical care time)  Medications Ordered in UC Medications  - No data to display  Initial Impression / Assessment and Plan / UC Course  I have reviewed the triage vital signs and the nursing notes.  Pertinent labs & imaging results that were available during my care of the patient were reviewed by me and considered in my medical decision making (  see chart for details).     Patient with possible sialoadenitis versus parotiditis.  Pain over the these glands likely radiating into ears.  Recommended to continue NSAIDs, use sour drops to express any possible stones.  Follow-up with ENT.Discussed strict return precautions. Patient verbalized understanding and is agreeable with plan.  Final Clinical Impressions(s) / UC Diagnoses   Final diagnoses:  Ear pain, bilateral  Sialadenitis     Discharge Instructions     Please take ibuprofen and tylenol, use sour drps even if pain ful to help expel possible stones  Follow up with ENT   ED Prescriptions    None     Controlled Substance Prescriptions [place] Controlled Substance Registry consulted? Not Applicable   Janith Lima, [place] 06/02/17 1150

## 2017-09-12 ENCOUNTER — Ambulatory Visit: Payer: BLUE CROSS/BLUE SHIELD | Admitting: Internal Medicine

## 2017-09-19 ENCOUNTER — Ambulatory Visit: Payer: BLUE CROSS/BLUE SHIELD | Admitting: Family

## 2017-09-19 ENCOUNTER — Other Ambulatory Visit (INDEPENDENT_AMBULATORY_CARE_PROVIDER_SITE_OTHER): Payer: BLUE CROSS/BLUE SHIELD

## 2017-09-19 ENCOUNTER — Ambulatory Visit (INDEPENDENT_AMBULATORY_CARE_PROVIDER_SITE_OTHER)
Admission: RE | Admit: 2017-09-19 | Discharge: 2017-09-19 | Disposition: A | Discharge status: Home / Self Care | Payer: BLUE CROSS/BLUE SHIELD | Source: Ambulatory Visit | Attending: Family | Admitting: Family

## 2017-09-19 ENCOUNTER — Encounter: Payer: Self-pay | Admitting: Family

## 2017-09-19 VITALS — BP 130/70 | HR 63 | Temp 97.7°F | Wt 197.0 lb

## 2017-09-19 DIAGNOSIS — Z1231 Encounter for screening mammogram for malignant neoplasm of breast: Secondary | ICD-10-CM

## 2017-09-19 DIAGNOSIS — M545 Low back pain, unspecified: Secondary | ICD-10-CM

## 2017-09-19 DIAGNOSIS — R109 Unspecified abdominal pain: Secondary | ICD-10-CM | POA: Diagnosis not present

## 2017-09-19 DIAGNOSIS — M25552 Pain in left hip: Secondary | ICD-10-CM

## 2017-09-19 LAB — CBC WITH DIFFERENTIAL/PLATELET
BASOS ABS: 0.1 10*3/uL (ref 0.0–0.1)
Basophils Relative: 1.1 % (ref 0.0–3.0)
EOS PCT: 4.8 % (ref 0.0–5.0)
Eosinophils Absolute: 0.4 10*3/uL (ref 0.0–0.7)
HCT: 38.2 % (ref 36.0–46.0)
Hemoglobin: 12.7 g/dL (ref 12.0–15.0)
Lymphocytes Relative: 47.9 % — ABNORMAL HIGH (ref 12.0–46.0)
Lymphs Abs: 3.6 10*3/uL (ref 0.7–4.0)
MCHC: 33.3 g/dL (ref 30.0–36.0)
MCV: 87.2 fl (ref 78.0–100.0)
MONOS PCT: 5.3 % (ref 3.0–12.0)
Monocytes Absolute: 0.4 10*3/uL (ref 0.1–1.0)
NEUTROS ABS: 3.1 10*3/uL (ref 1.4–7.7)
Neutrophils Relative %: 40.9 % — ABNORMAL LOW (ref 43.0–77.0)
PLATELETS: 373 10*3/uL (ref 150.0–400.0)
RBC: 4.38 Mil/uL (ref 3.87–5.11)
RDW: 15 % (ref 11.5–15.5)
WBC: 7.5 10*3/uL (ref 4.0–10.5)

## 2017-09-19 LAB — URINALYSIS
BILIRUBIN URINE: NEGATIVE
HGB URINE DIPSTICK: NEGATIVE
KETONES UR: NEGATIVE
Leukocytes, UA: NEGATIVE
Nitrite: NEGATIVE
PH: 7 (ref 5.0–8.0)
Specific Gravity, Urine: 1.02 (ref 1.000–1.030)
TOTAL PROTEIN, URINE-UPE24: NEGATIVE
URINE GLUCOSE: NEGATIVE
Urobilinogen, UA: 0.2 (ref 0.0–1.0)

## 2017-09-19 LAB — COMPREHENSIVE METABOLIC PANEL
ALT: 14 U/L (ref 0–35)
AST: 14 U/L (ref 0–37)
Albumin: 4.1 g/dL (ref 3.5–5.2)
Alkaline Phosphatase: 90 U/L (ref 39–117)
BILIRUBIN TOTAL: 0.5 mg/dL (ref 0.2–1.2)
BUN: 18 mg/dL (ref 6–23)
CALCIUM: 9 mg/dL (ref 8.4–10.5)
CO2: 28 meq/L (ref 19–32)
Chloride: 109 mEq/L (ref 96–112)
Creatinine, Ser: 0.71 mg/dL (ref 0.40–1.20)
GFR: 107.99 mL/min (ref >60.00)
GLUCOSE: 102 mg/dL — AB (ref 70–99)
POTASSIUM: 4 meq/L (ref 3.5–5.1)
Sodium: 141 mEq/L (ref 135–145)
Total Protein: 7.4 g/dL (ref 6.0–8.3)

## 2017-09-19 MED ORDER — MELOXICAM 7.5 MG PO TABS: 7.5000 mg | ORAL_TABLET | Freq: Every day | ORAL | 0 refills | Status: DC

## 2017-09-19 NOTE — Progress Notes (Signed)
Melanie Ewing is a 62 y.o. female with the following history as recorded in Epic[employer]:  Patient Active Problem List   Diagnosis Date Noted  . Fibroid uterus 02/09/2016  . 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 Cervical cancer screening 01/17/2016  . Deformity, hand, claw, left 01/17/2016  . Radiculitis of right cervical region 02/10/2013  . Other abnormal glucose 12/09/2012  . 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 Constipation 12/09/2012  . Routine general medical examination at a health care facility 05/20/2012  . Visit for screening mammogram 05/20/2012    Current Outpatient Medications  Medication Sig Dispense Refill  . fish oil-omega-3 fatty acids 1000 MG capsule Take 1 g by mouth daily.    Marland Kitchen linaclotide (LINZESS) 145 MCG CAPS capsule Take 1 capsule (145 mcg total) by mouth daily before breakfast. 90 capsule 3  . Multiple Vitamin (MULTIVITAMIN) tablet Take 1 tablet by mouth daily.    . traMADol (ULTRAM) 50 MG tablet Take 1 tablet (50 mg total) by mouth every 6 (six) hours as needed. 75 tablet 2  . umeclidinium-vilanterol (ANORO ELLIPTA) 62.5-25 MCG/INH AEPB Inhale 1 puff into the lungs daily. 90 each 1  . fluticasone (FLONASE) 50 MCG/ACT nasal spray Place 2 sprays into both nostrils daily for 7 days. 1 g 0  . meloxicam (MOBIC) 7.5 MG tablet Take 1 tablet (7.5 mg total) by mouth daily. Prn pain 30 tablet 0   No current facility-administered medications for this visit.     Allergies: Patient has no known allergies.  Past Medical History:  Diagnosis Date  . Arthritis     History reviewed. No pertinent surgical history.  Family History  Problem Relation Age of Onset  . Arthritis Other   . Diabetes Other   . Hyperlipidemia Other   . Hypertension Other   . Stroke Other   . Alcohol abuse Mother   . Heart disease Father   . Heart disease Sister   . Diabetes Sister   . Cancer Neg Hx   . COPD Neg Hx   . Kidney disease Neg Hx      Social History   Tobacco Use  . Smoking status: Former Smoker    Packs/day: 0.50    Types: Cigarettes    Last attempt to quit: 03/09/2012    Years since quitting: 5.5  . Smokeless tobacco: Never Used  Substance Use Topics  . Alcohol use: No    Subjective:  Patient presents with concerns for "intermittent" left hip pain x 3 weeks; no known injury or trauma; does have history of arthritis; describes as "sharp" pain that lasts for just a few seconds; no changes in bowel or bladder habits; no pain radiating into lower leg; feels like something is "catching" in her hip; Not taking anything for pain; requesting medication today;   Objective:  Vitals:   09/19/17 1112  BP: 130/70  Pulse: 63  Temp: 97.7 F (36.5 C)  TempSrc: Oral  Weight: 197 lb 0.6 oz (89.4 kg)    General: Well developed, well nourished, in no acute distress  Skin : Warm and dry.  Head: Normocephalic and atraumatic  Lungs: Respirations unlabored; clear to auscultation bilaterally without wheeze, rales, rhonchi  CVS exam: normal rate and regular rhythm.  Musculoskeletal: No deformities; no active joint inflammation  Extremities: No edema, cyanosis, clubbing  Vessels: Symmetric bilaterally  Neurologic: Alert and oriented; speech intact; face symmetrical; moves all extremities well; CNII-XII intact without focal deficit   Assessment:  1.  Left hip pain   2. Acute left-sided low back pain without sciatica   3. Left flank pain   4. Screening mammogram, encounter for     Plan:  Suspect arthritis changes; update left hip and lumbar X-ray; trial of Mobic 7.5 mg daily; will also check CBC, CMP, U/A today; follow-up to be determined; Order updated for mammogram; she plans to get flu shot through her employer.  No follow-ups on file.  Orders Placed This Encounter  Procedures  . MM Digital Screening    Standing Status:   Future    Standing Expiration Date:   11/20/2018    Order Specific Question:   Reason for Exam  (SYMPTOM  OR DIAGNOSIS REQUIRED)    Answer:   screening mammogram    Order Specific Question:   Is the patient pregnant?    Answer:   No    Order Specific Question:   Preferred imaging location?    Answer:   [facility]  . DG HIP UNILAT WITH PELVIS 2-3 VIEWS LEFT    Standing Status:   Future    Number of Occurrences:   1    Standing Expiration Date:   11/20/2018    Order Specific Question:   Reason for Exam (SYMPTOM  OR DIAGNOSIS REQUIRED)    Answer:   left hip pain    Order Specific Question:   Is patient pregnant?    Answer:   No    Order Specific Question:   Preferred imaging location?    Answer:   [facility]    Order Specific Question:   Radiology Contrast Protocol - do NOT remove file path    Answer:   \\[email or web address]\DXFluoroContrastProtocols.pdf  . DG Lumbar Spine 2-3 Views    Standing Status:   Future    Number of Occurrences:   1    Standing Expiration Date:   11/20/2018    Order Specific Question:   Reason for Exam (SYMPTOM  OR DIAGNOSIS REQUIRED)    Answer:   low back pain    Order Specific Question:   Is patient pregnant?    Answer:   No    Order Specific Question:   Preferred imaging location?    Answer:   [facility]    Order Specific Question:   Radiology Contrast Protocol - do NOT remove file path    Answer:   \\[email or web address]\Radiant\DXFluoroContrastProtocols.pdf  . CBC w/Diff    Standing Status:   Future    Number of Occurrences:   1    Standing Expiration Date:   09/19/2018  . Comp Met (CMET)    Standing Status:   Future    Number of Occurrences:   1    Standing Expiration Date:   09/19/2018  . Urinalysis    Standing Status:   Future    Number of Occurrences:   1    Standing Expiration Date:   09/19/2018    Requested Prescriptions   Signed Prescriptions Disp Refills  . meloxicam (MOBIC) 7.5 MG tablet 30 tablet 0    Sig: Take 1 tablet (7.5 mg total) by mouth daily. Prn pain

## 2017-09-26 ENCOUNTER — Ambulatory Visit: Payer: BLUE CROSS/BLUE SHIELD | Admitting: Internal Medicine

## 2017-10-16 ENCOUNTER — Other Ambulatory Visit: Payer: Self-pay | Admitting: Family

## 2017-10-29 ENCOUNTER — Ambulatory Visit
Admission: RE | Admit: 2017-10-29 | Discharge: 2017-10-29 | Disposition: A | Discharge status: Home / Self Care | Payer: BLUE CROSS/BLUE SHIELD | Source: Ambulatory Visit | Attending: Family | Admitting: Family

## 2017-10-29 DIAGNOSIS — Z1231 Encounter for screening mammogram for malignant neoplasm of breast: Secondary | ICD-10-CM

## 2017-11-15 ENCOUNTER — Other Ambulatory Visit: Payer: Self-pay | Admitting: Internal Medicine

## 2017-11-15 DIAGNOSIS — M5412 Radiculopathy, cervical region: Secondary | ICD-10-CM

## 2017-11-15 NOTE — Telephone Encounter (Signed)
Requested medication (s) are due for refill today: yes  Requested medication (s) are on the active medication list: yes    Last refill: 01/04/17  #75  2 refills  Future visit scheduled no  Notes to clinic:not delegated  Requested Prescriptions  Pending Prescriptions Disp Refills   traMADol (ULTRAM) 50 MG tablet 75 tablet 2    Sig: Take 1 tablet (50 mg total) by mouth every 6 (six) hours as needed.     Not Delegated - Analgesics:  Opioid Agonists Failed - 11/15/2017 12:59 PM      Failed - This refill cannot be delegated      Failed - Urine Drug Screen completed in last 360 days.      Failed - Valid encounter within last 6 months    Recent Outpatient Visits          1 month ago Left hip pain   [place], Marvis Repress, [place]   10 months ago [facility] -Mayer Camel, MD   1 year ago Cervical radiculopathy   [place], Enid Baas, MD   1 year ago Moderate left ankle sprain, initial encounter   [place], Gregory D, FNP   1 year ago Primary osteoarthritis of both [facility] -Mayer Camel, MD

## 2017-11-15 NOTE — Telephone Encounter (Signed)
Copied from [place] [id number]. Topic: Quick Communication - See Telephone Encounter >> Nov 15, 2017 10:37 AM Ivar Drape wrote: CRM for notification. See Telephone encounter for: 11/15/17. Patient would like a refill on her traMADol (ULTRAM) 50 MG tablet medication and have it sent to her preferred pharmacy [facility] on [address] Melanie Ewing, [place]

## 2017-11-15 NOTE — Telephone Encounter (Signed)
Copied from [place] [id number]. Topic: Quick Communication - See Telephone Encounter >> Nov 15, 2017 10:37 AM Ivar Drape wrote: CRM for notification. See Telephone encounter for: 11/15/17.

## 2017-11-15 NOTE — Telephone Encounter (Signed)
LVM for patient to call back and make appt

## 2017-11-22 NOTE — Telephone Encounter (Signed)
Called patient, she is in [place], she will call back to sch appt

## 2017-12-13 ENCOUNTER — Ambulatory Visit (INDEPENDENT_AMBULATORY_CARE_PROVIDER_SITE_OTHER)
Admission: RE | Admit: 2017-12-13 | Discharge: 2017-12-13 | Disposition: A | Discharge status: Home / Self Care | Payer: BLUE CROSS/BLUE SHIELD | Source: Ambulatory Visit | Attending: Internal Medicine | Admitting: Internal Medicine

## 2017-12-13 ENCOUNTER — Encounter: Payer: Self-pay | Admitting: Internal Medicine

## 2017-12-13 ENCOUNTER — Other Ambulatory Visit (INDEPENDENT_AMBULATORY_CARE_PROVIDER_SITE_OTHER): Payer: BLUE CROSS/BLUE SHIELD

## 2017-12-13 ENCOUNTER — Ambulatory Visit: Payer: BLUE CROSS/BLUE SHIELD | Admitting: Internal Medicine

## 2017-12-13 VITALS — BP 128/80 | HR 57 | Temp 98.8°F | Ht 64.0 in | Wt 200.0 lb

## 2017-12-13 DIAGNOSIS — Z0000 Encounter for general adult medical examination without abnormal findings: Secondary | ICD-10-CM

## 2017-12-13 DIAGNOSIS — G8929 Other chronic pain: Secondary | ICD-10-CM

## 2017-12-13 DIAGNOSIS — K5900 Constipation, unspecified: Secondary | ICD-10-CM

## 2017-12-13 DIAGNOSIS — R109 Unspecified abdominal pain: Secondary | ICD-10-CM

## 2017-12-13 DIAGNOSIS — Z1159 Encounter for screening for other viral diseases: Secondary | ICD-10-CM

## 2017-12-13 DIAGNOSIS — R911 Solitary pulmonary nodule: Secondary | ICD-10-CM

## 2017-12-13 DIAGNOSIS — K7581 Nonalcoholic steatohepatitis (NASH): Secondary | ICD-10-CM

## 2017-12-13 DIAGNOSIS — J849 Interstitial pulmonary disease, unspecified: Secondary | ICD-10-CM

## 2017-12-13 DIAGNOSIS — K5904 Chronic idiopathic constipation: Secondary | ICD-10-CM | POA: Diagnosis not present

## 2017-12-13 LAB — LIPID PANEL
Cholesterol: 171 mg/dL (ref 0–200)
HDL: 60.2 mg/dL (ref >39.00)
LDL Cholesterol: 93 mg/dL (ref 0–99)
NonHDL: 110.33
Total CHOL/HDL Ratio: 3
Triglycerides: 88 mg/dL (ref 0.0–149.0)
VLDL: 17.6 mg/dL (ref 0.0–40.0)

## 2017-12-13 LAB — BASIC METABOLIC PANEL
BUN: 11 mg/dL (ref 6–23)
CO2: 28 mEq/L (ref 19–32)
Calcium: 9.2 mg/dL (ref 8.4–10.5)
Chloride: 106 mEq/L (ref 96–112)
Creatinine, Ser: 0.73 mg/dL (ref 0.40–1.20)
GFR: 104.5 mL/min (ref >60.00)
Glucose, Bld: 91 mg/dL (ref 70–99)
Potassium: 3.9 mEq/L (ref 3.5–5.1)
Sodium: 140 mEq/L (ref 135–145)

## 2017-12-13 LAB — URINALYSIS, ROUTINE W REFLEX MICROSCOPIC
BILIRUBIN URINE: NEGATIVE
Ketones, ur: NEGATIVE
Nitrite: NEGATIVE
Specific Gravity, Urine: 1.02 (ref 1.000–1.030)
Total Protein, Urine: NEGATIVE
URINE GLUCOSE: NEGATIVE
Urobilinogen, UA: 1 (ref 0.0–1.0)
pH: 6 (ref 5.0–8.0)

## 2017-12-13 LAB — TSH: TSH: 1.91 u[IU]/mL (ref 0.35–4.50)

## 2017-12-13 MED ORDER — LINACLOTIDE 145 MCG PO CAPS: 145.0000 ug | ORAL_CAPSULE | Freq: Every day | ORAL | 1 refills | Status: DC

## 2017-12-13 NOTE — Progress Notes (Signed)
Subjective:  Patient ID: Melanie Ewing, female    DOB: 04-02-1955  Age: 62 y.o. MRN: [id number]  CC: Annual Exam   HPI Chirstina Haan [place] presents for a CPX.  She complains of a 68-month history of left flank, left hip, and left upper back pain.  She was seen elsewhere a few weeks ago and it looks like plain films of her hip were done that showed mild degenerative changes.  Her urine was negative for blood or any other abnormal elements.  She is taking meloxicam for symptom relief.  She complains of constipation but she denies chest pain, shortness of breath, hemoptysis, wheezing, cough, dysuria, hematuria, loss of appetite, weight loss, or diarrhea.  Outpatient Medications Prior to Visit  Medication Sig Dispense Refill  . fish oil-omega-3 fatty acids 1000 MG capsule Take 1 g by mouth daily.    . Multiple Vitamin (MULTIVITAMIN) tablet Take 1 tablet by mouth daily.    Marland Kitchen umeclidinium-vilanterol (ANORO ELLIPTA) 62.5-25 MCG/INH AEPB Inhale 1 puff into the lungs daily. 90 each 1  . meloxicam (MOBIC) 7.5 MG tablet Take 1 tablet (7.5 mg total) by mouth daily. Prn pain 30 tablet 0  . fluticasone (FLONASE) 50 MCG/ACT nasal spray Place 2 sprays into both nostrils daily for 7 days. 1 g 0  . linaclotide (LINZESS) 145 MCG CAPS capsule Take 1 capsule (145 mcg total) by mouth daily before breakfast. 90 capsule 3  . traMADol (ULTRAM) 50 MG tablet Take 1 tablet (50 mg total) by mouth every 6 (six) hours as needed. 75 tablet 2   No facility-administered medications prior to visit.     ROS Review of Systems  Constitutional: Positive for unexpected weight change (wt gain). Negative for chills, diaphoresis and fatigue.  HENT: Negative.  Negative for trouble swallowing and voice change.   Eyes: Negative.   Respiratory: Negative.  Negative for cough, chest tightness, shortness of breath and wheezing.   Gastrointestinal: Negative.  Negative for abdominal pain, blood in stool, constipation, diarrhea,  nausea and vomiting.  Endocrine: Negative.   Genitourinary: Positive for flank pain. Negative for decreased urine volume, difficulty urinating, dysuria, hematuria, menstrual problem, urgency, vaginal bleeding, vaginal discharge and vaginal pain.  Musculoskeletal: Positive for arthralgias. Negative for back pain, joint swelling, myalgias and neck pain.  Skin: Negative.  Negative for color change and rash.  Neurological: Negative.  Negative for dizziness, weakness, light-headedness and headaches.  Hematological: Negative for adenopathy. Does not bruise/bleed easily.  Psychiatric/Behavioral: Negative.     Objective:  BP 128/80 (BP Location: Left Arm, Patient Position: Sitting, Cuff Size: Large)   Pulse (!) 57   Temp 98.8 F (37.1 C) (Oral)   Ht 5\' 4"  (1.626 m)   Wt 200 lb (90.7 kg)   SpO2 97%   BMI 34.33 kg/m   BP Readings from Last 3 Encounters:  12/13/17 128/80  09/19/17 130/70  06/01/17 140/74    Wt Readings from Last 3 Encounters:  12/13/17 200 lb (90.7 kg)  09/19/17 197 lb 0.6 oz (89.4 kg)  04/02/17 194 lb 7.1 oz (88.2 kg)    Physical Exam Vitals signs reviewed.  Constitutional:      General: She is not in acute distress.    Appearance: Normal appearance. She is not ill-appearing, toxic-appearing or diaphoretic.  HENT:     Nose: Nose normal.     Mouth/Throat:     Mouth: Mucous membranes are moist.     Pharynx: No posterior oropharyngeal erythema.  Eyes:  Conjunctiva/sclera: Conjunctivae normal.  Neck:     Musculoskeletal: Normal range of motion and neck supple. No neck rigidity or muscular tenderness.  Cardiovascular:     Rate and Rhythm: Normal rate and regular rhythm.     Chest Wall: PMI is not displaced.     Pulses: Normal pulses.     Heart sounds: Normal heart sounds. No murmur. No friction rub. No gallop.   Pulmonary:     Effort: Pulmonary effort is normal.     Breath sounds: Normal breath sounds. No decreased breath sounds, wheezing, rhonchi or rales.    Chest:     Chest wall: No mass, deformity, swelling, tenderness, crepitus or edema.  Abdominal:     General: Abdomen is flat. Bowel sounds are normal. There is no distension.     Palpations: Abdomen is soft. There is no hepatomegaly, splenomegaly or mass.     Tenderness: There is no abdominal tenderness.  Musculoskeletal: Normal range of motion.        General: No swelling, tenderness, deformity or signs of injury.     Right lower leg: No edema.     Left lower leg: No edema.  Lymphadenopathy:     Upper Body:     Right upper body: No supraclavicular, axillary or pectoral adenopathy.     Left upper body: No supraclavicular, axillary or pectoral adenopathy.  Skin:    General: Skin is warm and dry.     Findings: No rash.  Neurological:     General: No focal deficit present.     Mental Status: She is alert and oriented to person, place, and time. Mental status is at baseline.  Psychiatric:        Mood and Affect: Mood normal.        Thought Content: Thought content normal.        Judgment: Judgment normal.     Lab Results  Component Value Date   WBC 7.5 09/19/2017   HGB 12.7 09/19/2017   HCT 38.2 09/19/2017   PLT 373.0 09/19/2017   GLUCOSE 91 12/13/2017   CHOL 171 12/13/2017   TRIG 88.0 12/13/2017   HDL 60.20 12/13/2017   LDLCALC 93 12/13/2017   ALT 14 09/19/2017   AST 14 09/19/2017   NA 140 12/13/2017   K 3.9 12/13/2017   CL 106 12/13/2017   CREATININE 0.73 12/13/2017   BUN 11 12/13/2017   CO2 28 12/13/2017   TSH 1.91 12/13/2017   HGBA1C 6.1 01/17/2016   MICROALBUR 0.8 10/05/2014    Mm Digital Screening  Result Date: 10/30/2017 CLINICAL DATA:  Screening. EXAM: DIGITAL SCREENING BILATERAL MAMMOGRAM WITH CAD COMPARISON:  Previous exam(s). ACR Breast Density Category b: There are scattered areas of fibroglandular density. FINDINGS: There are no findings suspicious for malignancy. Images were processed with CAD. IMPRESSION: No mammographic evidence of malignancy. A  result letter of this screening mammogram will be mailed directly to the patient. RECOMMENDATION: Screening mammogram in one year. (Code:SM-B-01Y) BI-RADS CATEGORY  1: Negative. Electronically Signed   By: Kristopher Oppenheim M.D.   On: 10/30/2017 13:16    No results found.  Assessment & Plan:   Simran was seen today for annual exam.  Diagnoses and all orders for this visit:  Routine general medical examination at a health care facility- Exam completed, labs reviewed, vaccines reviewed and updated, her PAP/ mammogram/colon cancer screening are all up-to-date.  Patient education material was given. -     Lipid panel; Future -     HIV  Antibody (routine testing w rflx); Future  Need for hepatitis C screening test -     Hepatitis C antibody; Future  Chronic left flank pain- Her exam is unremarkable, she does not have any other systemic complaints, labs and urinalysis are normal.  Plain x-rays do not show anything that would cause her pain.  I think this is musculoskeletal in nature and have asked her to continue taking meloxicam as needed for the pain. -     Basic metabolic panel; Future -     Urinalysis, Routine w reflex microscopic; Future -     DG Abd Acute W/Chest; Future  Chronic idiopathic constipation- Her labs are negative for secondary causes.  I have asked her to take Linzess as needed for the constipation. -     Basic metabolic panel; Future -     TSH; Future  Constipation, unspecified constipation type -     linaclotide (LINZESS) 145 MCG CAPS capsule; Take 1 capsule (145 mcg total) by mouth daily before breakfast.  Interstitial lung disorders ([place])- This was seen on the plain films today.  I have asked her to undergo a high resolution CT of the chest to see if she has interstitial lung disease. -     CT CHEST HIGH RESOLUTION; Future   I have discontinued Benjamine Mola A. Antonucci's traMADol. I am also having her maintain her fish oil-omega-3 fatty acids, multivitamin,  umeclidinium-vilanterol, fluticasone, and linaclotide.  Meds ordered this encounter  Medications  . linaclotide (LINZESS) 145 MCG CAPS capsule    Sig: Take 1 capsule (145 mcg total) by mouth daily before breakfast.    Dispense:  90 capsule    Refill:  1     Follow-up: Return in about 6 weeks (around 01/24/2018).  Scarlette Calico, MD

## 2017-12-13 NOTE — Patient Instructions (Signed)
Flank Pain, Adult Flank pain is pain that is located on the side of the body between the upper abdomen and the back. This area is called the flank. The pain may occur over a short period of time (acute), or it may be long-term or recurring (chronic). It may be mild or severe. Flank pain can be caused by many things, including:  Muscle soreness or injury.  Kidney stones or kidney disease.  Stress.  A disease of the spine (vertebral disk disease).  A lung infection (pneumonia).  Fluid around the lungs (pulmonary edema).  A skin rash caused by the chickenpox virus (shingles).  Tumors that affect the back of the abdomen.  Gallbladder disease.  Follow these instructions at home:  Drink enough fluid to keep your urine clear or pale yellow.  Rest as told by your health care provider.  Take over-the-counter and prescription medicines only as told by your health care provider.  Keep a journal to track what has caused your flank pain and what has made it feel better.  Keep all follow-up visits as told by your health care provider. This is important. Contact a health care provider if:  Your pain is not controlled with medicine.  You have new symptoms.  Your pain gets worse.  You have a fever.  Your symptoms last longer than 2-3 days.  You have trouble urinating or you are urinating very frequently. Get help right away if:  You have trouble breathing or you are short of breath.  Your abdomen hurts or it is swollen or red.  You have nausea or vomiting.  You feel faint or you pass out.  You have blood in your urine. Summary  Flank pain is pain that is located on the side of the body between the upper abdomen and the back.  The pain may occur over a short period of time (acute), or it may be long-term or recurring (chronic). It may be mild or severe.  Flank pain can be caused by many things.  Contact your health care provider if your symptoms get worse or they last  longer than 2-3 days. This information is not intended to replace advice given to you by your health care provider. Make sure you discuss any questions you have with your health care provider. Document Released: 02/09/2005 Document Revised: 03/03/2016 Document Reviewed: 03/03/2016 Elsevier Interactive Patient Education  2018 Elsevier Inc.  

## 2017-12-14 ENCOUNTER — Telehealth: Payer: Self-pay | Admitting: Internal Medicine

## 2017-12-14 ENCOUNTER — Other Ambulatory Visit: Payer: Self-pay | Admitting: Internal Medicine

## 2017-12-14 DIAGNOSIS — J849 Interstitial pulmonary disease, unspecified: Secondary | ICD-10-CM | POA: Insufficient documentation

## 2017-12-14 LAB — HIV ANTIBODY (ROUTINE TESTING W REFLEX): HIV 1&2 Ab, 4th Generation: NONREACTIVE

## 2017-12-14 LAB — HEPATITIS C ANTIBODY
Hepatitis C Ab: NONREACTIVE
SIGNAL TO CUT-OFF: 0.04 (ref <1.00)

## 2017-12-14 MED ORDER — MELOXICAM 7.5 MG PO TABS: 7.5000 mg | ORAL_TABLET | Freq: Every day | ORAL | 0 refills | Status: DC

## 2017-12-14 NOTE — Telephone Encounter (Signed)
Sent PCP message through the result note.

## 2017-12-14 NOTE — Telephone Encounter (Signed)
Copied from [place] [id number]. Topic: Quick Communication - See Telephone Encounter >> Dec 14, 2017  9:47 AM Rutherford Nail, NT wrote: CRM for notification. See Telephone encounter for: 12/14/17. Patient calling and states that she had seen Dr Ronnald Ramp yesterday and was told that he would prescribe a pain medication. States that she check with her pharmacy and a pain medication was not sent. Would like to know if that could be sent today? [place], [address] AT [place]

## 2017-12-14 NOTE — Telephone Encounter (Signed)
Patient is calling to check on the status of this request. Please advise

## 2017-12-14 NOTE — Telephone Encounter (Signed)
Requested Prescriptions  Pending Prescriptions Disp Refills  . meloxicam (MOBIC) 7.5 MG tablet 30 tablet 0    Sig: Take 1 tablet (7.5 mg total) by mouth daily. Prn pain     Analgesics:  COX2 Inhibitors Passed - 12/14/2017  2:29 PM      Passed - HGB in normal range and within 360 days    Hemoglobin  Date Value Ref Range Status  09/19/2017 12.7 12.0 - 15.0 g/dL Final         Passed - Cr in normal range and within 360 days    Creatinine, Ser  Date Value Ref Range Status  12/13/2017 0.73 0.40 - 1.20 mg/dL Final         Passed - Patient is not pregnant      Passed - Valid encounter within last 12 months    Recent Outpatient Visits          Yesterday Routine general medical examination at a health care facility   [place], [place]   2 months ago Left hip pain   [place], Marvis Repress, [place]   11 months ago [facility] -Mayer Camel, MD   1 year ago Cervical radiculopathy   [place], Enid Baas, MD   1 year ago Moderate left ankle sprain, initial encounter   [place], FNP

## 2017-12-14 NOTE — Telephone Encounter (Signed)
Copied from [place] [id number]. Topic: Quick Communication - Rx Refill/Question >> Dec 14, 2017  1:51 PM Judyann Munson wrote: Medication: meloxicam (MOBIC) 7.5 MG tablet  Has the patient contacted their pharmacy? Yes   Preferred Pharmacy (with phone number or street name): [id number] - [place], [address] AT [place] [phone] (Phone) [phone] (Fax)    Agent: Please be advised that RX refills may take up to 3 business days. We ask that you follow-up with your pharmacy.

## 2017-12-28 ENCOUNTER — Ambulatory Visit (INDEPENDENT_AMBULATORY_CARE_PROVIDER_SITE_OTHER)
Admission: RE | Admit: 2017-12-28 | Discharge: 2017-12-28 | Disposition: A | Discharge status: Home / Self Care | Payer: BLUE CROSS/BLUE SHIELD | Source: Ambulatory Visit | Attending: Internal Medicine | Admitting: Internal Medicine

## 2017-12-28 DIAGNOSIS — J849 Interstitial pulmonary disease, unspecified: Secondary | ICD-10-CM

## 2017-12-30 ENCOUNTER — Encounter: Payer: Self-pay | Admitting: Internal Medicine

## 2017-12-30 ENCOUNTER — Other Ambulatory Visit: Payer: Self-pay | Admitting: Internal Medicine

## 2017-12-30 DIAGNOSIS — K7581 Nonalcoholic steatohepatitis (NASH): Secondary | ICD-10-CM | POA: Insufficient documentation

## 2017-12-30 DIAGNOSIS — R911 Solitary pulmonary nodule: Secondary | ICD-10-CM | POA: Insufficient documentation

## 2018-01-04 ENCOUNTER — Other Ambulatory Visit: Payer: Self-pay | Admitting: Internal Medicine

## 2018-01-05 MED ORDER — MELOXICAM 7.5 MG PO TABS: 7.5000 mg | ORAL_TABLET | Freq: Every day | ORAL | 0 refills | Status: DC

## 2018-01-31 ENCOUNTER — Encounter: Payer: Self-pay | Admitting: Internal Medicine

## 2018-02-01 ENCOUNTER — Encounter: Payer: Self-pay | Admitting: Internal Medicine

## 2018-02-10 ENCOUNTER — Encounter: Payer: Self-pay | Admitting: Internal Medicine

## 2018-02-18 ENCOUNTER — Encounter: Payer: Self-pay | Admitting: Internal Medicine

## 2018-02-19 ENCOUNTER — Other Ambulatory Visit: Payer: Self-pay | Admitting: Internal Medicine

## 2018-02-19 DIAGNOSIS — G8929 Other chronic pain: Secondary | ICD-10-CM | POA: Insufficient documentation

## 2018-02-19 DIAGNOSIS — M5442 Lumbago with sciatica, left side: Principal | ICD-10-CM

## 2018-03-01 ENCOUNTER — Ambulatory Visit: Payer: BLUE CROSS/BLUE SHIELD | Admitting: Family Medicine

## 2018-03-06 ENCOUNTER — Telehealth: Payer: Self-pay | Admitting: Internal Medicine

## 2018-03-06 NOTE — Telephone Encounter (Signed)
Referral coordinator is going to try to send referral to [facility] for sports med since patient has [employer].

## 2018-05-16 ENCOUNTER — Encounter: Payer: Self-pay | Admitting: Internal Medicine

## 2018-06-30 ENCOUNTER — Other Ambulatory Visit: Payer: Self-pay | Admitting: Internal Medicine

## 2018-06-30 DIAGNOSIS — R918 Other nonspecific abnormal finding of lung field: Secondary | ICD-10-CM

## 2018-06-30 DIAGNOSIS — R911 Solitary pulmonary nodule: Secondary | ICD-10-CM

## 2018-07-01 ENCOUNTER — Telehealth: Payer: Self-pay | Admitting: Internal Medicine

## 2018-07-01 NOTE — Telephone Encounter (Signed)
I don't know any but she really needs to get an appointment with [facility] to have this scan done to screen for lung cancer  TJ

## 2018-07-01 NOTE — Telephone Encounter (Signed)
Not yet. I advised she look for in network doctors through her insurance. Do you recommend any from [facility] for her?

## 2018-07-01 NOTE — Telephone Encounter (Signed)
Doe she have a new doctor?

## 2018-07-01 NOTE — Telephone Encounter (Signed)
-----   Message from Octavio Manns sent at 07/01/2018 11:04 AM EDT ----- Melanie Ewing:  Melanie Ewing put an order for pt to have a follow up CT scan. Unfortunately she can't see you no more because of her insurance. I will cancel this order.   I did call her to let her know you put this order in, so when she finds a new PCP to let them know she needs a follow up CT scan done.  She also wanted me to tell you Hi :) and she misses you being her doctor.  thanks  [employer]

## 2018-10-01 ENCOUNTER — Encounter: Payer: Self-pay | Admitting: Gynecology

## 2019-02-05 ENCOUNTER — Encounter: Payer: Self-pay | Admitting: Internal Medicine

## 2019-02-05 ENCOUNTER — Ambulatory Visit (INDEPENDENT_AMBULATORY_CARE_PROVIDER_SITE_OTHER): Payer: 59 | Admitting: Internal Medicine

## 2019-02-05 ENCOUNTER — Other Ambulatory Visit: Payer: Self-pay

## 2019-02-05 VITALS — BP 126/82 | HR 76 | Temp 98.4°F | Ht 64.0 in | Wt 197.4 lb

## 2019-02-05 DIAGNOSIS — Z0000 Encounter for general adult medical examination without abnormal findings: Secondary | ICD-10-CM

## 2019-02-05 DIAGNOSIS — R7303 Prediabetes: Secondary | ICD-10-CM | POA: Diagnosis not present

## 2019-02-05 DIAGNOSIS — Z0001 Encounter for general adult medical examination with abnormal findings: Secondary | ICD-10-CM | POA: Diagnosis not present

## 2019-02-05 DIAGNOSIS — M5136 Other intervertebral disc degeneration, lumbar region: Secondary | ICD-10-CM

## 2019-02-05 DIAGNOSIS — M51369 Other intervertebral disc degeneration, lumbar region without mention of lumbar back pain or lower extremity pain: Secondary | ICD-10-CM | POA: Insufficient documentation

## 2019-02-05 DIAGNOSIS — R911 Solitary pulmonary nodule: Secondary | ICD-10-CM | POA: Diagnosis not present

## 2019-02-05 DIAGNOSIS — K7581 Nonalcoholic steatohepatitis (NASH): Secondary | ICD-10-CM

## 2019-02-05 DIAGNOSIS — Z1231 Encounter for screening mammogram for malignant neoplasm of breast: Secondary | ICD-10-CM

## 2019-02-05 DIAGNOSIS — K5904 Chronic idiopathic constipation: Secondary | ICD-10-CM | POA: Diagnosis not present

## 2019-02-05 DIAGNOSIS — Z124 Encounter for screening for malignant neoplasm of cervix: Secondary | ICD-10-CM

## 2019-02-05 LAB — BASIC METABOLIC PANEL
BUN: 15 mg/dL (ref 6–23)
CO2: 28 mEq/L (ref 19–32)
Calcium: 9.2 mg/dL (ref 8.4–10.5)
Chloride: 104 mEq/L (ref 96–112)
Creatinine, Ser: 0.68 mg/dL (ref 0.40–1.20)
GFR: 106.3 mL/min (ref >60.00)
Glucose, Bld: 65 mg/dL — ABNORMAL LOW (ref 70–99)
Potassium: 3.7 mEq/L (ref 3.5–5.1)
Sodium: 139 mEq/L (ref 135–145)

## 2019-02-05 LAB — LIPID PANEL
Cholesterol: 170 mg/dL (ref 0–200)
HDL: 58.6 mg/dL (ref >39.00)
LDL Cholesterol: 89 mg/dL (ref 0–99)
NonHDL: 111.87
Total CHOL/HDL Ratio: 3
Triglycerides: 114 mg/dL (ref 0.0–149.0)
VLDL: 22.8 mg/dL (ref 0.0–40.0)

## 2019-02-05 LAB — POCT GLYCOSYLATED HEMOGLOBIN (HGB A1C): Hemoglobin A1C: 5.9 % — AB (ref 4.0–5.6)

## 2019-02-05 LAB — HEPATIC FUNCTION PANEL
ALT: 14 U/L (ref 0–35)
AST: 14 U/L (ref 0–37)
Albumin: 4.2 g/dL (ref 3.5–5.2)
Alkaline Phosphatase: 93 U/L (ref 39–117)
Bilirubin, Direct: 0.1 mg/dL (ref 0.0–0.3)
Total Bilirubin: 0.4 mg/dL (ref 0.2–1.2)
Total Protein: 7.5 g/dL (ref 6.0–8.3)

## 2019-02-05 LAB — TSH: TSH: 1.7 u[IU]/mL (ref 0.35–4.50)

## 2019-02-05 MED ORDER — MOTEGRITY 2 MG PO TABS: 1.0000 | ORAL_TABLET | Freq: Every day | ORAL | 1 refills | Status: DC

## 2019-02-05 NOTE — Patient Instructions (Signed)
Health Maintenance, Female Adopting a healthy lifestyle and getting preventive care are important in promoting health and wellness. Ask your health care provider about:  The right schedule for you to have regular tests and exams.  Things you can do on your own to prevent diseases and keep yourself healthy. What should I know about diet, weight, and exercise? Eat a healthy diet   Eat a diet that includes plenty of vegetables, fruits, low-fat dairy products, and lean protein.  Do not eat a lot of foods that are high in solid fats, added sugars, or sodium. Maintain a healthy weight Body mass index (BMI) is used to identify weight problems. It estimates body fat based on height and weight. Your health care provider can help determine your BMI and help you achieve or maintain a healthy weight. Get regular exercise Get regular exercise. This is one of the most important things you can do for your health. Most adults should:  Exercise for at least 150 minutes each week. The exercise should increase your heart rate and make you sweat (moderate-intensity exercise).  Do strengthening exercises at least twice a week. This is in addition to the moderate-intensity exercise.  Spend less time sitting. Even light physical activity can be beneficial. Watch cholesterol and blood lipids Have your blood tested for lipids and cholesterol at 62 years of age, then have this test every 5 years. Have your cholesterol levels checked more often if:  Your lipid or cholesterol levels are high.  You are older than 62 years of age.  You are at high risk for heart disease. What should I know about cancer screening? Depending on your health history and family history, you may need to have cancer screening at various ages. This may include screening for:  Breast cancer.  Cervical cancer.  Colorectal cancer.  Skin cancer.  Lung cancer. What should I know about heart disease, diabetes, and high blood  pressure? Blood pressure and heart disease  High blood pressure causes heart disease and increases the risk of stroke. This is more likely to develop in people who have high blood pressure readings, are of African descent, or are overweight.  Have your blood pressure checked: ? Every 3-5 years if you are 18-39 years of age. ? Every year if you are 40 years old or older. Diabetes Have regular diabetes screenings. This checks your fasting blood sugar level. Have the screening done:  Once every three years after age 40 if you are at a normal weight and have a low risk for diabetes.  More often and at a younger age if you are overweight or have a high risk for diabetes. What should I know about preventing infection? Hepatitis B If you have a higher risk for hepatitis B, you should be screened for this virus. Talk with your health care provider to find out if you are at risk for hepatitis B infection. Hepatitis C Testing is recommended for:  Everyone born from 1945 through 1965.  Anyone with known risk factors for hepatitis C. Sexually transmitted infections (STIs)  Get screened for STIs, including gonorrhea and chlamydia, if: ? You are sexually active and are younger than 62 years of age. ? You are older than 62 years of age and your health care provider tells you that you are at risk for this type of infection. ? Your sexual activity has changed since you were last screened, and you are at increased risk for chlamydia or gonorrhea. Ask your health care provider if   you are at risk.  Ask your health care provider about whether you are at high risk for HIV. Your health care provider may recommend a prescription medicine to help prevent HIV infection. If you choose to take medicine to prevent HIV, you should first get tested for HIV. You should then be tested every 3 months for as long as you are taking the medicine. Pregnancy  If you are about to stop having your period (premenopausal) and  you may become pregnant, seek counseling before you get pregnant.  Take 400 to 800 micrograms (mcg) of folic acid every day if you become pregnant.  Ask for birth control (contraception) if you want to prevent pregnancy. Osteoporosis and menopause Osteoporosis is a disease in which the bones lose minerals and strength with aging. This can result in bone fractures. If you are 65 years old or older, or if you are at risk for osteoporosis and fractures, ask your health care provider if you should:  Be screened for bone loss.  Take a calcium or vitamin D supplement to lower your risk of fractures.  Be given hormone replacement therapy (HRT) to treat symptoms of menopause. Follow these instructions at home: Lifestyle  Do not use any products that contain nicotine or tobacco, such as cigarettes, e-cigarettes, and chewing tobacco. If you need help quitting, ask your health care provider.  Do not use street drugs.  Do not share needles.  Ask your health care provider for help if you need support or information about quitting drugs. Alcohol use  Do not drink alcohol if: ? Your health care provider tells you not to drink. ? You are pregnant, may be pregnant, or are planning to become pregnant.  If you drink alcohol: ? Limit how much you use to 0-1 drink a day. ? Limit intake if you are breastfeeding.  Be aware of how much alcohol is in your drink. In the [place] one drink equals one 12 oz bottle of beer (355 mL), one 5 oz glass of wine (148 mL), or one 1 oz glass of hard liquor (44 mL). General instructions  Schedule regular health, dental, and eye exams.  Stay current with your vaccines.  Tell your health care provider if: ? You often feel depressed. ? You have ever been abused or do not feel safe at home. Summary  Adopting a healthy lifestyle and getting preventive care are important in promoting health and wellness.  Follow your health care provider's instructions about healthy  diet, exercising, and getting tested or screened for diseases.  Follow your health care provider's instructions on monitoring your cholesterol and blood pressure. This information is not intended to replace advice given to you by your health care provider. Make sure you discuss any questions you have with your health care provider. Document Revised: 12/12/2017 Document Reviewed: 12/12/2017 Elsevier Patient Education  2020 Elsevier Inc.  

## 2019-02-05 NOTE — Progress Notes (Signed)
Subjective:  Patient ID: Melanie Ewing, female    DOB: [birth year]  Age: 64 y.o. MRN: [id number]  CC: Annual Exam  This visit occurred during the SARS-CoV-2 public health emergency.  Safety protocols were in place, including screening questions prior to the visit, additional usage of staff PPE, and extensive cleaning of exam room while observing appropriate contact time as indicated for disinfecting solutions.    HPI Melanie Ewing presents for a CPX.  She complains of chronic, recurrent episodes of constipation and bloating.  She previously tried Linzess but she says it caused stool urgency so she quit taking it.  She has not been taking anything recently to treat the constipation.  She continues to complain of left lower back and left hip pain.  This was previously attributed to degenerative disc and degenerative joint disease.  She is controlling the pain with Tylenol and Motrin.  She has no back pain that radiates towards her extremities and she denies paresthesias.  Outpatient Medications Prior to Visit  Medication Sig Dispense Refill  . Multiple Vitamin (MULTIVITAMIN) tablet Take 1 tablet by mouth daily.    . fish oil-omega-3 fatty acids 1000 MG capsule Take 1 g by mouth daily.    Marland Kitchen linaclotide (LINZESS) 145 MCG CAPS capsule Take 1 capsule (145 mcg total) by mouth daily before breakfast. 90 capsule 1  . meloxicam (MOBIC) 7.5 MG tablet Take 1 tablet (7.5 mg total) by mouth daily. Prn pain 90 tablet 0  . umeclidinium-vilanterol (ANORO ELLIPTA) 62.5-25 MCG/INH AEPB Inhale 1 puff into the lungs daily. 90 each 1  . diclofenac (VOLTAREN) 75 MG EC tablet Take 75 mg by mouth 2 (two) times daily.    Marland Kitchen gabapentin (NEURONTIN) 300 MG capsule Take 300 mg by mouth 3 (three) times daily.    Marland Kitchen doxycycline (VIBRA-TABS) 100 MG tablet Take 100 mg by mouth 2 (two) times daily.    . fluticasone (FLONASE) 50 MCG/ACT nasal spray Place 2 sprays into both nostrils daily for 7 days. 1 g 0  .  neomycin-polymyxin-hydrocortisone (CORTISPORIN) 3.5-10000-1 OTIC suspension     . predniSONE (DELTASONE) 20 MG tablet Take 20 mg by mouth daily.     No facility-administered medications prior to visit.    ROS Review of Systems  Constitutional: Negative.  Negative for chills, diaphoresis, fatigue and fever.  HENT: Negative.   Eyes: Negative.   Respiratory: Negative for cough, chest tightness, shortness of breath and wheezing.   Cardiovascular: Negative for chest pain, palpitations and leg swelling.  Gastrointestinal: Positive for constipation. Negative for abdominal pain, blood in stool, diarrhea, nausea and vomiting.  Endocrine: Negative.  Negative for cold intolerance and heat intolerance.  Genitourinary: Negative for difficulty urinating.  Musculoskeletal: Positive for arthralgias and back pain. Negative for myalgias.  Skin: Negative.  Negative for color change and rash.  Neurological: Negative.   Hematological: Negative.   Psychiatric/Behavioral: Negative.     Objective:  BP 126/82 (BP Location: Left Arm, Patient Position: Sitting, Cuff Size: Normal)   Pulse 76   Temp 98.4 F (36.9 C) (Oral)   Ht 5' 4"  (1.626 m)   Wt 197 lb 6 oz (89.5 kg)   SpO2 95%   BMI 33.88 kg/m   BP Readings from Last 3 Encounters:  02/05/19 126/82  12/13/17 128/80  09/19/17 130/70    Wt Readings from Last 3 Encounters:  02/05/19 197 lb 6 oz (89.5 kg)  12/13/17 200 lb (90.7 kg)  09/19/17 197 lb 0.6 oz (89.4 kg)  Physical Exam Vitals reviewed.  HENT:     Nose: Nose normal.  Eyes:     General: No scleral icterus.    Conjunctiva/sclera: Conjunctivae normal.  Cardiovascular:     Rate and Rhythm: Normal rate and regular rhythm.     Heart sounds: No murmur.  Pulmonary:     Effort: Pulmonary effort is normal.     Breath sounds: No stridor. No wheezing, rhonchi or rales.  Abdominal:     General: Abdomen is protuberant. Bowel sounds are normal. There is no distension.     Palpations:  Abdomen is soft. There is no hepatomegaly or splenomegaly.     Tenderness: There is no abdominal tenderness.     Hernia: No hernia is present.  Musculoskeletal:        General: Normal range of motion.     Cervical back: Neck supple.     Lumbar back: Normal.     Right hip: Normal.     Left hip: Normal.     Right lower leg: No swelling. No edema.     Left lower leg: No swelling. No edema.     Comments: Neg SLR in BLE  Lymphadenopathy:     Cervical: No cervical adenopathy.  Skin:    General: Skin is warm and dry.  Neurological:     General: No focal deficit present.     Mental Status: She is alert and oriented to person, place, and time. Mental status is at baseline.  Psychiatric:        Mood and Affect: Mood normal.        Behavior: Behavior normal.     Lab Results  Component Value Date   WBC 7.5 09/19/2017   HGB 12.7 09/19/2017   HCT 38.2 09/19/2017   PLT 373.0 09/19/2017   GLUCOSE 65 (L) 02/05/2019   CHOL 170 02/05/2019   TRIG 114.0 02/05/2019   HDL 58.60 02/05/2019   LDLCALC 89 02/05/2019   ALT 14 02/05/2019   AST 14 02/05/2019   NA 139 02/05/2019   K 3.7 02/05/2019   CL 104 02/05/2019   CREATININE 0.68 02/05/2019   BUN 15 02/05/2019   CO2 28 02/05/2019   TSH 1.70 02/05/2019   HGBA1C 5.9 (A) 02/05/2019   MICROALBUR 0.8 10/05/2014    CT CHEST HIGH RESOLUTION  Result Date: 12/28/2017 CLINICAL DATA:  64 year old female with history of abnormal chest x-ray with shortness of breath. EXAM: CT CHEST WITHOUT CONTRAST TECHNIQUE: Multidetector CT imaging of the chest was performed following the standard protocol without intravenous contrast. High resolution imaging of the lungs, as well as inspiratory and expiratory imaging, was performed. COMPARISON:  None. FINDINGS: Cardiovascular: Heart size is normal. There is no significant pericardial fluid, thickening or pericardial calcification. Aortic atherosclerosis. No definite coronary artery calcifications. Mediastinum/Nodes:  No pathologically enlarged mediastinal or hilar lymph nodes. Please note that accurate exclusion of hilar adenopathy is limited on noncontrast CT scans. Esophagus is unremarkable in appearance. No axillary lymphadenopathy. Lungs/Pleura: 9 x 7 mm (mean diameter of 8 mm) nodular area of architectural distortion in the inferior segment of the lingula (axial image 93 of series 7). Small calcified granuloma in the right lower lobe. No acute consolidative airspace disease. No pleural effusions. High-resolution images demonstrate no significant regions of ground-glass attenuation, subpleural reticulation, parenchymal banding, traction bronchiectasis or frank honeycombing. Inspiratory and expiratory imaging demonstrates mild air trapping indicative of small airways disease. Upper Abdomen: Diffuse low attenuation throughout the visualized portions of the hepatic parenchyma, indicative of  hepatic steatosis. Musculoskeletal: There are no aggressive appearing lytic or blastic lesions noted in the visualized portions of the skeleton. IMPRESSION: 1. No findings to suggest interstitial lung disease. 2. Mild air trapping, indicative of small airways disease. 3. 8 mm mean diameter nodular area of architectural distortion in the inferior segment of the lingula, favored to represent an area of scarring. Non-contrast chest CT at 6-12 months is recommended. If the nodule is stable at time of repeat CT, then future CT at 18-24 months (from today's scan) is considered optional for low-risk patients, but is recommended for high-risk patients. This recommendation follows the consensus statement: Guidelines for Management of Incidental Pulmonary Nodules Detected on CT Images: From the Fleischner Society 2017; Radiology 2017; 284:228-243. 4. Hepatic steatosis. 5. Aortic atherosclerosis. Aortic Atherosclerosis (ICD10-I70.0). Electronically Signed   By: Vinnie Langton M.D.   On: 12/28/2017 20:16    Assessment & Plan:   Melanie Ewing was seen  today for annual exam.  Diagnoses and all orders for this visit:  Nonalcoholic steatohepatitis (NASH)- Her LFTs are normal now. -     Hepatic function panel  Prediabetes- Her A1c is at 5.9%.  Medical therapy is not indicated. -     POCT glycosylated hemoglobin (Hb A1C) -     Basic metabolic panel  Routine general medical examination at a health care facility- Exam completed, labs reviewed, vaccines reviewed, colon cancer screening is up-to-date, she is referred for breast cancer and cervical cancer screening, patient education was given. -     Lipid panel  Nodule of left lung- She is due for follow-up on this. -     CT Chest Wo Contrast; Future  Visit for screening mammogram -     MM DIGITAL SCREENING BILATERAL; Future  Cervical cancer screening -     Ambulatory referral to Gynecology  DDD (degenerative disc disease), lumbar- I do not see any clinical changes related to this.  I have encouraged her to continue taking Tylenol and ibuprofen as needed for the discomfort.  Chronic idiopathic constipation -     Prucalopride Succinate (MOTEGRITY) 2 MG TABS; Take 1 tablet (2 mg total) by mouth daily. -     TSH   I have discontinued Melanie Ewing's fish oil-omega-3 fatty acids, umeclidinium-vilanterol, fluticasone, linaclotide, meloxicam, doxycycline, neomycin-polymyxin-hydrocortisone, and predniSONE. I am also having her start on Motegrity. Additionally, I am having her maintain her multivitamin, diclofenac, and gabapentin.  Meds ordered this encounter  Medications  . Prucalopride Succinate (MOTEGRITY) 2 MG TABS    Sig: Take 1 tablet (2 mg total) by mouth daily.    Dispense:  90 tablet    Refill:  1     Follow-up: Return in about 6 months (around 08/05/2019).  Scarlette Calico, MD

## 2019-02-07 ENCOUNTER — Encounter: Payer: Self-pay | Admitting: Internal Medicine

## 2019-02-10 ENCOUNTER — Other Ambulatory Visit: Payer: Self-pay

## 2019-02-11 ENCOUNTER — Ambulatory Visit (INDEPENDENT_AMBULATORY_CARE_PROVIDER_SITE_OTHER): Payer: 59 | Admitting: Obstetrics and Gynecology

## 2019-02-11 ENCOUNTER — Encounter: Payer: Self-pay | Admitting: Obstetrics and Gynecology

## 2019-02-11 VITALS — BP 124/82 | Ht 64.0 in | Wt 197.0 lb

## 2019-02-11 DIAGNOSIS — L299 Pruritus, unspecified: Secondary | ICD-10-CM | POA: Diagnosis not present

## 2019-02-11 DIAGNOSIS — Z01419 Encounter for gynecological examination (general) (routine) without abnormal findings: Secondary | ICD-10-CM

## 2019-02-11 NOTE — Patient Instructions (Addendum)
Please remember to schedule your annual mammogram this year Talk to your primary doctor about a referral for a colonoscopy screening this year Plan to get a bone density scan again in 2 years We will help you with an ENT doctor referral for your ear concerns

## 2019-02-11 NOTE — Progress Notes (Signed)
   Melanie Ewing [birth year] [id number]  SUBJECTIVE:  64 y.o. [id number] female for annual routine gynecologic exam. She has no gynecologic concerns.  She indicates that her internal ears are itchy bilaterally and she gets swelling on her external ear structures.  She tried some antibiotic eardrops in the past which helped briefly but then symptoms came back.  Current Outpatient Medications  Medication Sig Dispense Refill  . Multiple Vitamin (MULTIVITAMIN) tablet Take 1 tablet by mouth daily.     No current facility-administered medications for this visit.   Allergies: Patient has no known allergies.  No LMP recorded. Patient is postmenopausal.  Past medical history,surgical history, problem list, medications, allergies, family history and social history were all reviewed and documented as reviewed in the EPIC chart.  ROS:  Feeling well. No dyspnea or chest pain on exertion.  No abdominal pain, change in bowel habits, black or bloody stools.  No urinary tract symptoms. GYN ROS: no abnormal bleeding, pelvic pain or discharge, no breast pain or new or enlarging lumps on self exam. No neurological complaints.    OBJECTIVE:  BP 124/82   Ht 5' 4"  (1.626 m)   Wt 197 lb (89.4 kg)   BMI 33.81 kg/m  The patient appears well, alert, oriented x 3, in no distress. ENT normal.  Neck supple. No cervical or supraclavicular adenopathy or thyromegaly.  Lungs are clear, good air entry, no wheezes, rhonchi or rales. S1 and S2 normal, no murmurs, regular rate and rhythm.  Abdomen soft without tenderness, guarding, mass or organomegaly.  Neurological is normal, no focal findings.  BREAST EXAM: breasts appear normal, no suspicious masses, no skin or nipple changes or axillary nodes  PELVIC EXAM: VULVA: normal appearing vulva with no masses, tenderness or lesions, VAGINA: normal appearing vagina with normal color and discharge, no lesions, CERVIX: normal appearing cervix without discharge or lesions,  UTERUS: retroverted uterus somewhat difficult to palpated but is of normal size, shape, consistency and nontender, ADNEXA: normal adnexa in size, nontender and no masses, RECTAL: normal rectal, no masses  Chaperone: Caryn Bee present during the examination  ASSESSMENT:  64 y.o. [id number] here for annual gynecologic exam  PLAN:   1. Postmenopausal.  No significant symptoms at this time.  History of fibroids identified on 2018 ultrasound noted. No need for follow up unless symptoms (pain, pelvic pressure, etc). 2. Pap smear/HPV 02/2016 was normal.  Not repeated today. No prior history of abnormal Pap smears. Next Pap smear due 2023 following the current screening guidelines calling for the 5-year interval. 3. Mammogram 10/2017. Will continue with annual mammography, and I reminded her to schedule one again this year. Breast exam normal today. 4. Colonoscopy 2011. Recommended that she continue per the prescribed interval, would be due this year.  Recommended that she schedule and she will plan to talk to her primary doctor to help with a referral. 5. Ear problem. I have asked staff to help the patient with an ENT referral. 6. DEXA 02/2016 with normal bone density, recommended 5 year follow up in 2023. 7. Health maintenance.  No lab work as she has this completed with her primary care provider.    Return annually or sooner, prn.  Joseph Pierini MD  02/11/19

## 2019-02-12 ENCOUNTER — Telehealth: Payer: Self-pay | Admitting: *Deleted

## 2019-02-12 ENCOUNTER — Encounter: Payer: Self-pay | Admitting: *Deleted

## 2019-02-12 DIAGNOSIS — H938X3 Other specified disorders of ear, bilateral: Secondary | ICD-10-CM

## 2019-02-12 DIAGNOSIS — L299 Pruritus, unspecified: Secondary | ICD-10-CM

## 2019-02-12 NOTE — Telephone Encounter (Signed)
Referral placed at ENT [place] office with [facility], they will call to schedule.

## 2019-02-12 NOTE — Telephone Encounter (Signed)
-----   Message from Joseph Pierini, MD sent at 02/11/2019  4:24 PM EST ----- Regarding: ENT referral Are you able to please help Melanie Ewing with an ENT referral for her bilateral ear swelling and internal itching? Thanks.

## 2019-02-21 NOTE — Telephone Encounter (Signed)
Patient on 02/27/19 @ 2:15pm with Dr.Newman

## 2019-02-26 ENCOUNTER — Other Ambulatory Visit: Payer: Self-pay

## 2019-02-26 ENCOUNTER — Ambulatory Visit (INDEPENDENT_AMBULATORY_CARE_PROVIDER_SITE_OTHER)
Admission: RE | Admit: 2019-02-26 | Discharge: 2019-02-26 | Disposition: A | Discharge status: Home / Self Care | Payer: 59 | Source: Ambulatory Visit | Attending: Internal Medicine | Admitting: Internal Medicine

## 2019-02-26 DIAGNOSIS — R911 Solitary pulmonary nodule: Secondary | ICD-10-CM | POA: Diagnosis not present

## 2019-02-27 ENCOUNTER — Ambulatory Visit (INDEPENDENT_AMBULATORY_CARE_PROVIDER_SITE_OTHER): Payer: 59 | Admitting: Otolaryngology

## 2019-02-27 VITALS — Temp 97.2°F

## 2019-02-27 DIAGNOSIS — L299 Pruritus, unspecified: Secondary | ICD-10-CM | POA: Diagnosis not present

## 2019-02-27 NOTE — Progress Notes (Signed)
HPI: Melanie Ewing is a 64 y.o. female who presents is referred by her PCP for evaluation of chronic itching in her ears.  She has had this problem for a number of years.  She has had previous history of infection in her ears and has been treated with antibiotic eardrops in the past but keeps having recurrent itching.  She is occasionally has swelling around her ear but does not have any swelling today.  She has no pain in the ear presently and no drainage.  She hears okay.  When she has itching in her ear she frequently uses Q-tips as she cannot get her fingers down where the itching is located.  Past Medical History:  Diagnosis Date  . Arthritis    No past surgical history on file. Social History   Socioeconomic History  . Marital status: Single    Spouse name: Not on file  . Number of children: Not on file  . Years of education: Not on file  . Highest education level: Not on file  Occupational History  . 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 Smoking status: Former Smoker    Packs/day: 0.50    Types: Cigarettes    Quit date: 03/09/2012    Years since quitting: 6.9  . Smokeless tobacco: Never Used  Substance and Sexual Activity  . Alcohol use: Yes    Comment: Social  . Drug use: No  . Sexual activity: Not Currently    Comment: 1st intercourse 64 yo-Fewer than 5 partners  Other Topics Concern  . Not on file  Social History Narrative  . Not on file   Social Determinants of Health   Financial Resource Strain:   . Difficulty of Paying Living Expenses: Not on file  Food Insecurity:   . Worried About Charity fundraiser in the Last Year: Not on file  . Ran Out of Food in the Last Year: Not on file  Transportation Needs:   . Lack of Transportation (Medical): Not on file  . Lack of Transportation (Non-Medical): Not on file  Physical Activity:   . Days of Exercise per Week: Not on file  . Minutes of Exercise per Session: Not on file  Stress:   .  Feeling of Stress : Not on file  Social Connections:   . Frequency of Communication with Friends and Family: Not on file  . Frequency of Social Gatherings with Friends and Family: Not on file  . Attends Religious Services: Not on file  . Active Member of Clubs or Organizations: Not on file  . Attends Archivist Meetings: Not on file  . Marital Status: Not on file   Family History  Problem Relation Age of Onset  . Alcohol abuse Mother   . Heart disease Father   . Heart disease Sister   . Diabetes Sister   . Cancer Neg Hx   . COPD Neg Hx   . Kidney disease Neg Hx    No Known Allergies Prior to Admission medications   Medication Sig Start Date End Date Taking? Authorizing Provider  Multiple Vitamin (MULTIVITAMIN) tablet Take 1 tablet by mouth daily.   Yes [provider]     Positive ROS: Otherwise negative  All other systems have been reviewed and were otherwise negative with the exception of those mentioned in the HPI and as above.  Physical Exam: Constitutional: Alert, well-appearing, no acute distress Ears: External ears without lesions or tenderness. Ear  canals are clear bilaterally.  She has no swelling.  No erythema or drainage.  Both TMs are clear.  Ear canals are dry. Nasal: External nose without lesions. Septum midline with mild rhinitis.. Clear nasal passages Oral: Lips and gums without lesions. Tongue and palate mucosa without lesions. Posterior oropharynx clear. Neck: No palpable adenopathy or masses Respiratory: Breathing comfortably  Skin: No facial/neck lesions or rash noted.  Procedures  Assessment: Intermittent itching in her ear canals  Plan: Suggested use of fluocinolone oil 0.01% drops 4 to 5 drops in the ear canal twice daily as needed itching.   Radene Journey, MD   CC:

## 2019-03-01 ENCOUNTER — Encounter: Payer: Self-pay | Admitting: Internal Medicine

## 2019-03-01 ENCOUNTER — Other Ambulatory Visit: Payer: Self-pay | Admitting: Internal Medicine

## 2019-03-01 DIAGNOSIS — R911 Solitary pulmonary nodule: Secondary | ICD-10-CM

## 2019-03-07 ENCOUNTER — Other Ambulatory Visit: Payer: Self-pay | Admitting: Internal Medicine

## 2019-03-07 ENCOUNTER — Telehealth: Payer: Self-pay | Admitting: Internal Medicine

## 2019-03-07 DIAGNOSIS — R911 Solitary pulmonary nodule: Secondary | ICD-10-CM

## 2019-03-07 NOTE — Telephone Encounter (Signed)
I have ordered a pulm referral to get a second opinion

## 2019-03-07 NOTE — Telephone Encounter (Signed)
Pt has been scheduled with [facility] and she is aware of appt

## 2019-03-07 NOTE — Telephone Encounter (Signed)
-----   Message from Octavio Manns sent at 03/07/2019 10:17 AM EST ----- Insurance has denied pt's PET scan: The letter of denial states: "You are not know already to have cancer in the lung and have not undergone a test (biopsy) confirming cancer"  So I called the insurance and they sent a message to Hassan Rowan, the person who was assigned to the case.   She called back and left me a vm: "denied because there is no strong suspicion of malignancy or solid tumor with cooperation of biopsy"  I tried to call back and talk with her but they were unable to get me back with her and wanted me to go to the appeals dept.   Please advise on what to do next  Thanks Cecille Rubin

## 2019-03-13 ENCOUNTER — Ambulatory Visit
Admission: RE | Admit: 2019-03-13 | Discharge: 2019-03-13 | Disposition: A | Discharge status: Home / Self Care | Payer: 59 | Source: Ambulatory Visit | Attending: Internal Medicine | Admitting: Internal Medicine

## 2019-03-13 ENCOUNTER — Ambulatory Visit: Payer: 59

## 2019-03-13 ENCOUNTER — Other Ambulatory Visit: Payer: Self-pay

## 2019-03-13 DIAGNOSIS — Z1231 Encounter for screening mammogram for malignant neoplasm of breast: Secondary | ICD-10-CM

## 2019-03-14 LAB — HM MAMMOGRAPHY

## 2019-03-18 ENCOUNTER — Institutional Professional Consult (permissible substitution): Payer: 59 | Admitting: Critical Care Medicine

## 2019-05-07 ENCOUNTER — Other Ambulatory Visit: Payer: Self-pay

## 2019-05-07 ENCOUNTER — Encounter (INDEPENDENT_AMBULATORY_CARE_PROVIDER_SITE_OTHER): Payer: Self-pay | Admitting: Otolaryngology

## 2019-05-07 ENCOUNTER — Ambulatory Visit (INDEPENDENT_AMBULATORY_CARE_PROVIDER_SITE_OTHER): Payer: 59 | Admitting: Otolaryngology

## 2019-05-07 VITALS — Temp 98.2°F

## 2019-05-07 DIAGNOSIS — L299 Pruritus, unspecified: Secondary | ICD-10-CM | POA: Diagnosis not present

## 2019-05-07 NOTE — Progress Notes (Signed)
HPI: Melanie Ewing is a 64 y.o. female who returns today for evaluation of chronic itching ears right side worse than left.  She has had this for years.  I had seen her previously 2 months ago and treated her with fluocinolone oil 0.01% eardrops but this did not seem to help that much.  When her ear itches she rubs the skin around her ear which irritates this..  Past Medical History:  Diagnosis Date  . Arthritis    No past surgical history on file. Social History   Socioeconomic History  . Marital status: Single    Spouse name: Not on file  . Number of children: Not on file  . Years of education: Not on file  . Highest education level: Not on file  Occupational History  . 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 Smoking status: Former Smoker    Packs/day: 0.50    Years: 47.00    Pack years: 23.50    Types: Cigarettes    Start date: 39    Quit date: 03/09/2012    Years since quitting: 7.1  . Smokeless tobacco: Never Used  Substance and Sexual Activity  . Alcohol use: Yes    Comment: Social  . Drug use: No  . Sexual activity: Not Currently    Comment: 1st intercourse 64 yo-Fewer than 5 partners  Other Topics Concern  . Not on file  Social History Narrative  . Not on file   Social Determinants of Health   Financial Resource Strain:   . Difficulty of Paying Living Expenses:   Food Insecurity:   . Worried About Charity fundraiser in the Last Year:   . Arboriculturist in the Last Year:   Transportation Needs:   . Film/video editor (Medical):   Marland Kitchen Lack of Transportation (Non-Medical):   Physical Activity:   . Days of Exercise per Week:   . Minutes of Exercise per Session:   Stress:   . Feeling of Stress :   Social Connections:   . Frequency of Communication with Friends and Family:   . Frequency of Social Gatherings with Friends and Family:   . Attends Religious Services:   . Active Member of Clubs or Organizations:   . Attends Theatre manager Meetings:   Marland Kitchen Marital Status:    Family History  Problem Relation Age of Onset  . Alcohol abuse Mother   . Heart disease Father   . Heart disease Sister   . Diabetes Sister   . Cancer Neg Hx   . COPD Neg Hx   . Kidney disease Neg Hx    No Known Allergies Prior to Admission medications   Medication Sig Start Date End Date Taking? Authorizing Provider  Multiple Vitamin (MULTIVITAMIN) tablet Take 1 tablet by mouth daily.   Yes [provider]     Positive ROS: Otherwise negative  All other systems have been reviewed and were otherwise negative with the exception of those mentioned in the HPI and as above.  Physical Exam: Constitutional: Alert, well-appearing, no acute distress Ears: External ears without lesions or tenderness.  Ear canals and TMs are clear bilaterally.  No evidence of infection.  No surrounding skin abnormalities noted.  Questionable etiology of itching Nasal: External nose without lesions.  Mild rhinitis no signs of infection.. Clear nasal passages Oral: Lips and gums without lesions. Tongue and palate mucosa without lesions. Posterior oropharynx clear. Neck: No palpable adenopathy  or masses Respiratory: Breathing comfortably  Skin: No facial/neck lesions or rash noted.  Procedures  Assessment: Chronic itching ears  Plan: Discussed with her concerning possibly trying antihistamine such as Zyrtec or Benadryl at night.  When she has itching in the ear canal during the daytime suggested trying alcohol vinegar eardrops or baby oil type eardrop to see if this helps at all with itching. I also prescribed Diprolene 0.05% cream to apply twice daily x4 days as needed itching. She will follow-up as needed   Radene Journey, MD

## 2019-10-01 ENCOUNTER — Other Ambulatory Visit: Payer: Self-pay

## 2019-10-01 ENCOUNTER — Encounter: Payer: Self-pay | Admitting: Internal Medicine

## 2019-10-01 ENCOUNTER — Ambulatory Visit (INDEPENDENT_AMBULATORY_CARE_PROVIDER_SITE_OTHER): Payer: 59 | Admitting: Internal Medicine

## 2019-10-01 VITALS — BP 138/82 | HR 69 | Temp 98.6°F | Resp 16 | Ht 64.0 in | Wt 188.0 lb

## 2019-10-01 DIAGNOSIS — Z1211 Encounter for screening for malignant neoplasm of colon: Secondary | ICD-10-CM

## 2019-10-01 DIAGNOSIS — M159 Polyosteoarthritis, unspecified: Secondary | ICD-10-CM

## 2019-10-01 DIAGNOSIS — M170 Bilateral primary osteoarthritis of knee: Secondary | ICD-10-CM

## 2019-10-01 DIAGNOSIS — M5136 Other intervertebral disc degeneration, lumbar region: Secondary | ICD-10-CM | POA: Diagnosis not present

## 2019-10-01 DIAGNOSIS — M5412 Radiculopathy, cervical region: Secondary | ICD-10-CM | POA: Diagnosis not present

## 2019-10-01 DIAGNOSIS — M8949 Other hypertrophic osteoarthropathy, multiple sites: Secondary | ICD-10-CM

## 2019-10-01 DIAGNOSIS — Z23 Encounter for immunization: Secondary | ICD-10-CM

## 2019-10-01 DIAGNOSIS — M5442 Lumbago with sciatica, left side: Secondary | ICD-10-CM | POA: Diagnosis not present

## 2019-10-01 DIAGNOSIS — G8929 Other chronic pain: Secondary | ICD-10-CM

## 2019-10-01 MED ORDER — TRAMADOL HCL 50 MG PO TABS: 50.0000 mg | ORAL_TABLET | Freq: Four times a day (QID) | ORAL | 4 refills | Status: AC | PRN

## 2019-10-01 MED ORDER — ETODOLAC 400 MG PO TABS: 400.0000 mg | ORAL_TABLET | Freq: Two times a day (BID) | ORAL | 0 refills | Status: DC

## 2019-10-01 NOTE — Progress Notes (Signed)
Subjective:  Patient ID: Melanie Ewing, female    DOB: [birth year]  Age: 64 y.o. MRN: [id number]  CC: Osteoarthritis  This visit occurred during the SARS-CoV-2 public health emergency.  Safety protocols were in place, including screening questions prior to the visit, additional usage of staff PPE, and extensive cleaning of exam room while observing appropriate contact time as indicated for disinfecting solutions.    HPI Melanie Ewing presents for f/up - She continues to complain of pain in her neck, low back, and large joints.  The pain interferes with her ADLs and ability to sleep and rest.  She is not gotten much symptom relief with Tylenol and ibuprofen.  She denies any recent trauma or injury.  She said none of her joints get red or swollen.  Outpatient Medications Prior to Visit  Medication Sig Dispense Refill  . Multiple Vitamin (MULTIVITAMIN) tablet Take 1 tablet by mouth daily.     No facility-administered medications prior to visit.    ROS Review of Systems  Constitutional: Negative.  Negative for appetite change, diaphoresis, fatigue and unexpected weight change.  HENT: Negative.   Eyes: Negative for visual disturbance.  Respiratory: Negative for cough, chest tightness, shortness of breath and wheezing.   Cardiovascular: Negative for chest pain and palpitations.  Gastrointestinal: Negative for abdominal pain, constipation, diarrhea, nausea and vomiting.  Endocrine: Negative.   Genitourinary: Negative.  Negative for difficulty urinating.  Musculoskeletal: Positive for arthralgias, back pain and neck pain. Negative for myalgias.  Skin: Negative.  Negative for color change.  Neurological: Negative.  Negative for dizziness, weakness, light-headedness and numbness.  Hematological: Negative for adenopathy. Does not bruise/bleed easily.  Psychiatric/Behavioral: Negative.     Objective:  BP 138/82   Pulse 69   Temp 98.6 F (37 C) (Oral)   Resp 16   Ht 5' 4"   (1.626 m)   Wt 188 lb (85.3 kg)   SpO2 95%   BMI 32.27 kg/m   BP Readings from Last 3 Encounters:  10/01/19 138/82  02/11/19 124/82  02/05/19 126/82    Wt Readings from Last 3 Encounters:  10/01/19 188 lb (85.3 kg)  02/11/19 197 lb (89.4 kg)  02/05/19 197 lb 6 oz (89.5 kg)    Physical Exam Vitals reviewed.  Constitutional:      Appearance: Normal appearance.  HENT:     Nose: Nose normal.     Mouth/Throat:     Mouth: Mucous membranes are moist.  Eyes:     General: No scleral icterus.    Conjunctiva/sclera: Conjunctivae normal.  Cardiovascular:     Rate and Rhythm: Normal rate and regular rhythm.     Heart sounds: No murmur heard.   Pulmonary:     Effort: Pulmonary effort is normal.     Breath sounds: No stridor. No wheezing, rhonchi or rales.  Abdominal:     General: Abdomen is flat. Bowel sounds are normal. There is no distension.     Palpations: Abdomen is soft. There is no hepatomegaly or splenomegaly.     Tenderness: There is no abdominal tenderness.  Musculoskeletal:        General: Deformity present. No swelling, tenderness or signs of injury. Normal range of motion.     Cervical back: Neck supple.     Right lower leg: No edema.     Left lower leg: No edema.     Comments: DJD in knees and shoulders  Lymphadenopathy:     Cervical: No cervical adenopathy.  Skin:  General: Skin is warm and dry.     Coloration: Skin is not pale.     Findings: No erythema.  Neurological:     General: No focal deficit present.     Mental Status: She is alert.  Psychiatric:        Mood and Affect: Mood normal.        Behavior: Behavior normal.     Lab Results  Component Value Date   WBC 7.5 09/19/2017   HGB 12.7 09/19/2017   HCT 38.2 09/19/2017   PLT 373.0 09/19/2017   GLUCOSE 65 (L) 02/05/2019   CHOL 170 02/05/2019   TRIG 114.0 02/05/2019   HDL 58.60 02/05/2019   LDLCALC 89 02/05/2019   ALT 14 02/05/2019   AST 14 02/05/2019   NA 139 02/05/2019   K 3.7  02/05/2019   CL 104 02/05/2019   CREATININE 0.68 02/05/2019   BUN 15 02/05/2019   CO2 28 02/05/2019   TSH 1.70 02/05/2019   HGBA1C 5.9 (A) 02/05/2019   MICROALBUR 0.8 10/05/2014    MM DIGITAL SCREENING BILATERAL  Result Date: 03/14/2019 CLINICAL DATA:  Screening. EXAM: DIGITAL SCREENING BILATERAL MAMMOGRAM WITH CAD COMPARISON:  Previous exam(s). ACR Breast Density Category b: There are scattered areas of fibroglandular density. FINDINGS: There are no findings suspicious for malignancy. Images were processed with CAD. IMPRESSION: No mammographic evidence of malignancy. A result letter of this screening mammogram will be mailed directly to the patient. RECOMMENDATION: Screening mammogram in one year. (Code:SM-B-01Y) BI-RADS CATEGORY  1: Negative. Electronically Signed   By: Franki Cabot M.D.   On: 03/14/2019 13:10    Assessment & Plan:   Melanie Ewing was seen today for osteoarthritis.  Diagnoses and all orders for this visit:  Colon cancer screening -     Cologuard  Radiculitis of right cervical region -     traMADol (ULTRAM) 50 MG tablet; Take 1 tablet (50 mg total) by mouth every 6 (six) hours as needed. -     etodolac (LODINE) 400 MG tablet; Take 1 tablet (400 mg total) by mouth 2 (two) times daily.  Chronic midline low back pain with left-sided sciatica -     traMADol (ULTRAM) 50 MG tablet; Take 1 tablet (50 mg total) by mouth every 6 (six) hours as needed. -     etodolac (LODINE) 400 MG tablet; Take 1 tablet (400 mg total) by mouth 2 (two) times daily.  DDD (degenerative disc disease), lumbar -     traMADol (ULTRAM) 50 MG tablet; Take 1 tablet (50 mg total) by mouth every 6 (six) hours as needed. -     etodolac (LODINE) 400 MG tablet; Take 1 tablet (400 mg total) by mouth 2 (two) times daily.  Primary osteoarthritis of both knees- I will treat her musculoskeletal pain with tramadol and etodolac. -     traMADol (ULTRAM) 50 MG tablet; Take 1 tablet (50 mg total) by mouth every 6  (six) hours as needed. -     etodolac (LODINE) 400 MG tablet; Take 1 tablet (400 mg total) by mouth 2 (two) times daily.  Primary osteoarthritis involving multiple joints -     traMADol (ULTRAM) 50 MG tablet; Take 1 tablet (50 mg total) by mouth every 6 (six) hours as needed. -     etodolac (LODINE) 400 MG tablet; Take 1 tablet (400 mg total) by mouth 2 (two) times daily.  Other orders -     Flu Vaccine QUAD 6+ mos PF IM (Fluarix Quad PF)  I am having Melanie Ewing start on traMADol and etodolac. I am also having her maintain her multivitamin.  Meds ordered this encounter  Medications  . traMADol (ULTRAM) 50 MG tablet    Sig: Take 1 tablet (50 mg total) by mouth every 6 (six) hours as needed.    Dispense:  90 tablet    Refill:  4  . etodolac (LODINE) 400 MG tablet    Sig: Take 1 tablet (400 mg total) by mouth 2 (two) times daily.    Dispense:  180 tablet    Refill:  0     Follow-up: Return in about 6 months (around 03/30/2020).  Melanie Calico, MD

## 2019-10-01 NOTE — Patient Instructions (Signed)

## 2019-10-23 LAB — COLOGUARD: Cologuard: NEGATIVE

## 2020-01-02 ENCOUNTER — Other Ambulatory Visit: Payer: Self-pay | Admitting: Internal Medicine

## 2020-01-02 DIAGNOSIS — M5412 Radiculopathy, cervical region: Secondary | ICD-10-CM

## 2020-01-02 DIAGNOSIS — M159 Polyosteoarthritis, unspecified: Secondary | ICD-10-CM

## 2020-01-02 DIAGNOSIS — M8949 Other hypertrophic osteoarthropathy, multiple sites: Secondary | ICD-10-CM

## 2020-01-02 DIAGNOSIS — M170 Bilateral primary osteoarthritis of knee: Secondary | ICD-10-CM

## 2020-01-02 DIAGNOSIS — M5136 Other intervertebral disc degeneration, lumbar region: Secondary | ICD-10-CM

## 2020-01-02 DIAGNOSIS — G8929 Other chronic pain: Secondary | ICD-10-CM

## 2020-04-04 ENCOUNTER — Other Ambulatory Visit: Payer: Self-pay | Admitting: Internal Medicine

## 2020-04-04 DIAGNOSIS — M5442 Lumbago with sciatica, left side: Secondary | ICD-10-CM

## 2020-04-04 DIAGNOSIS — M8949 Other hypertrophic osteoarthropathy, multiple sites: Secondary | ICD-10-CM

## 2020-04-04 DIAGNOSIS — M5412 Radiculopathy, cervical region: Secondary | ICD-10-CM

## 2020-04-04 DIAGNOSIS — G8929 Other chronic pain: Secondary | ICD-10-CM

## 2020-04-04 DIAGNOSIS — M5136 Other intervertebral disc degeneration, lumbar region: Secondary | ICD-10-CM

## 2020-04-04 DIAGNOSIS — M159 Polyosteoarthritis, unspecified: Secondary | ICD-10-CM

## 2020-04-04 DIAGNOSIS — M170 Bilateral primary osteoarthritis of knee: Secondary | ICD-10-CM

## 2020-04-27 IMAGING — DX DG ABDOMEN ACUTE W/ 1V CHEST
3 series · 3 of 3 positions shown · non-contrast
Comparison: Lumbar spine series 09/19/2017.  CT abdomen 07/07/2010.

CLINICAL DATA: Chronic shortness of breath.  Left flank pain.

EXAM:
DG ABDOMEN ACUTE W/ 1V CHEST

[chest pa]
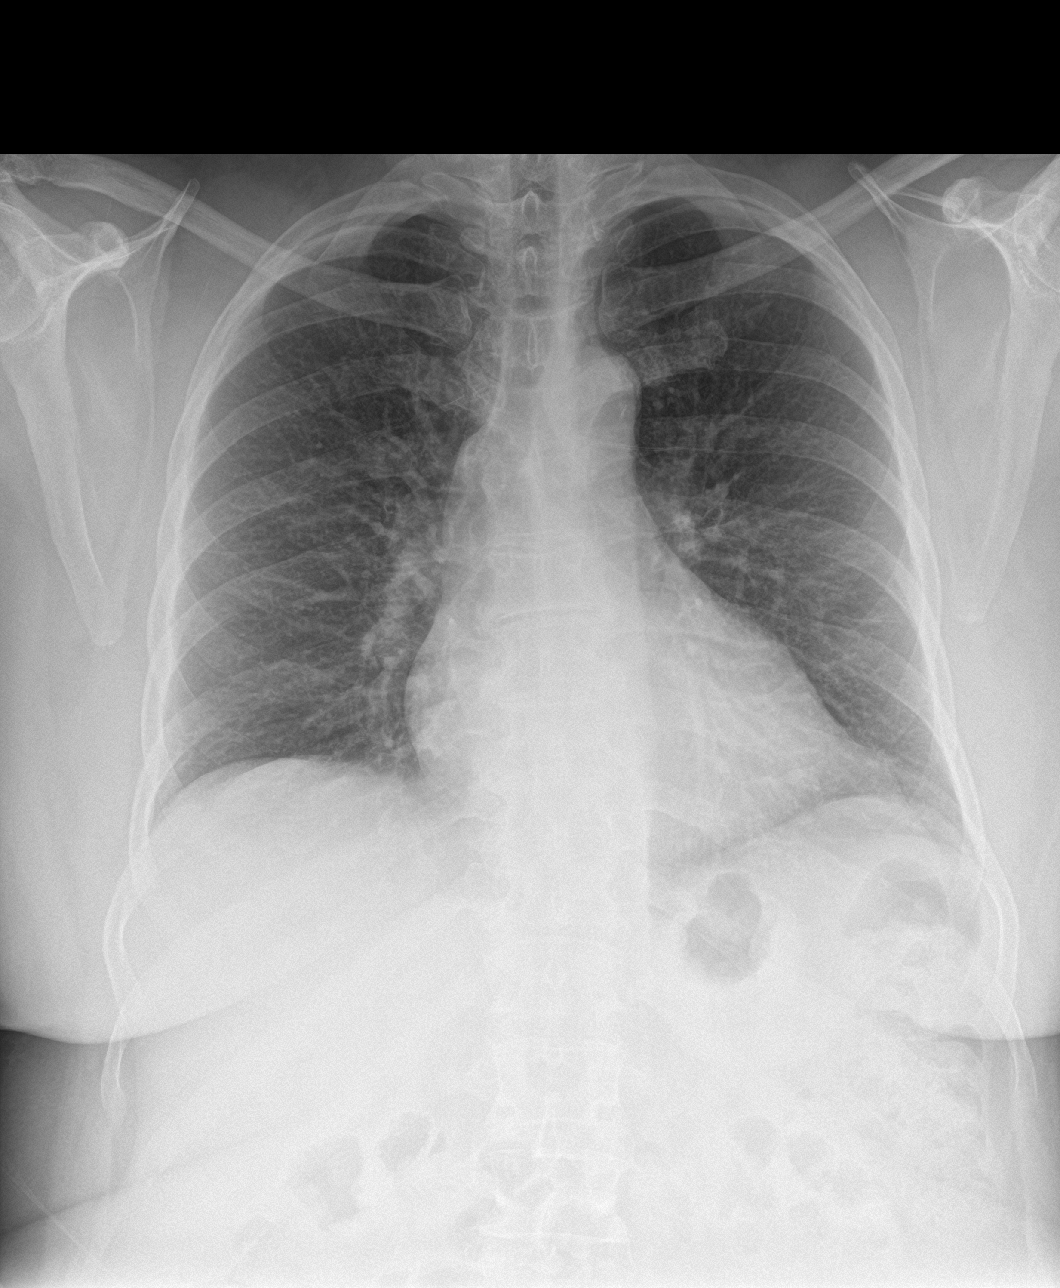

[abdomen erect]
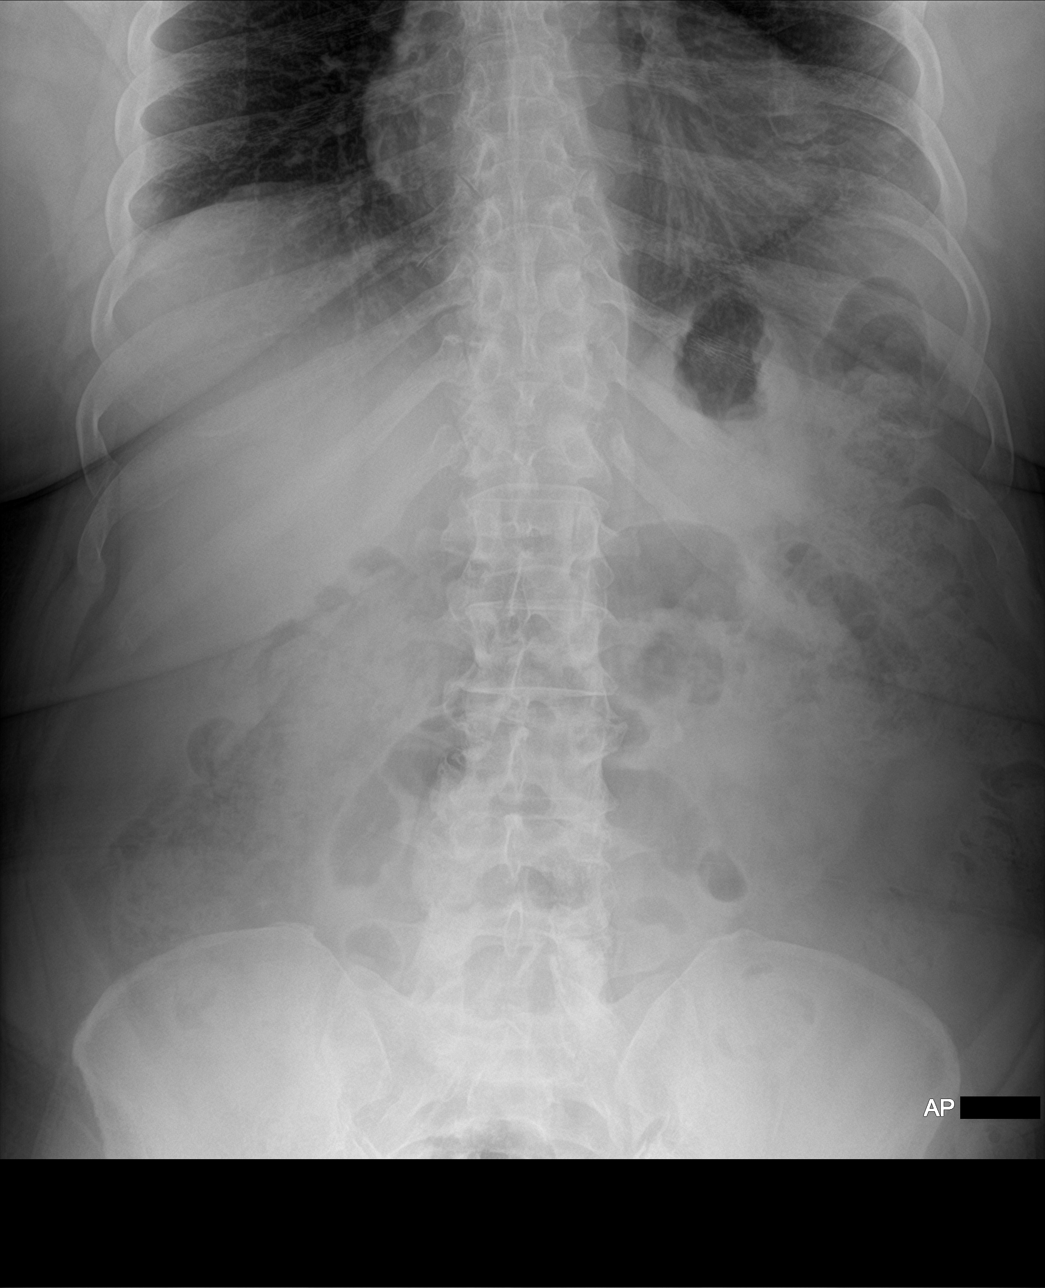

[abdomen supine]
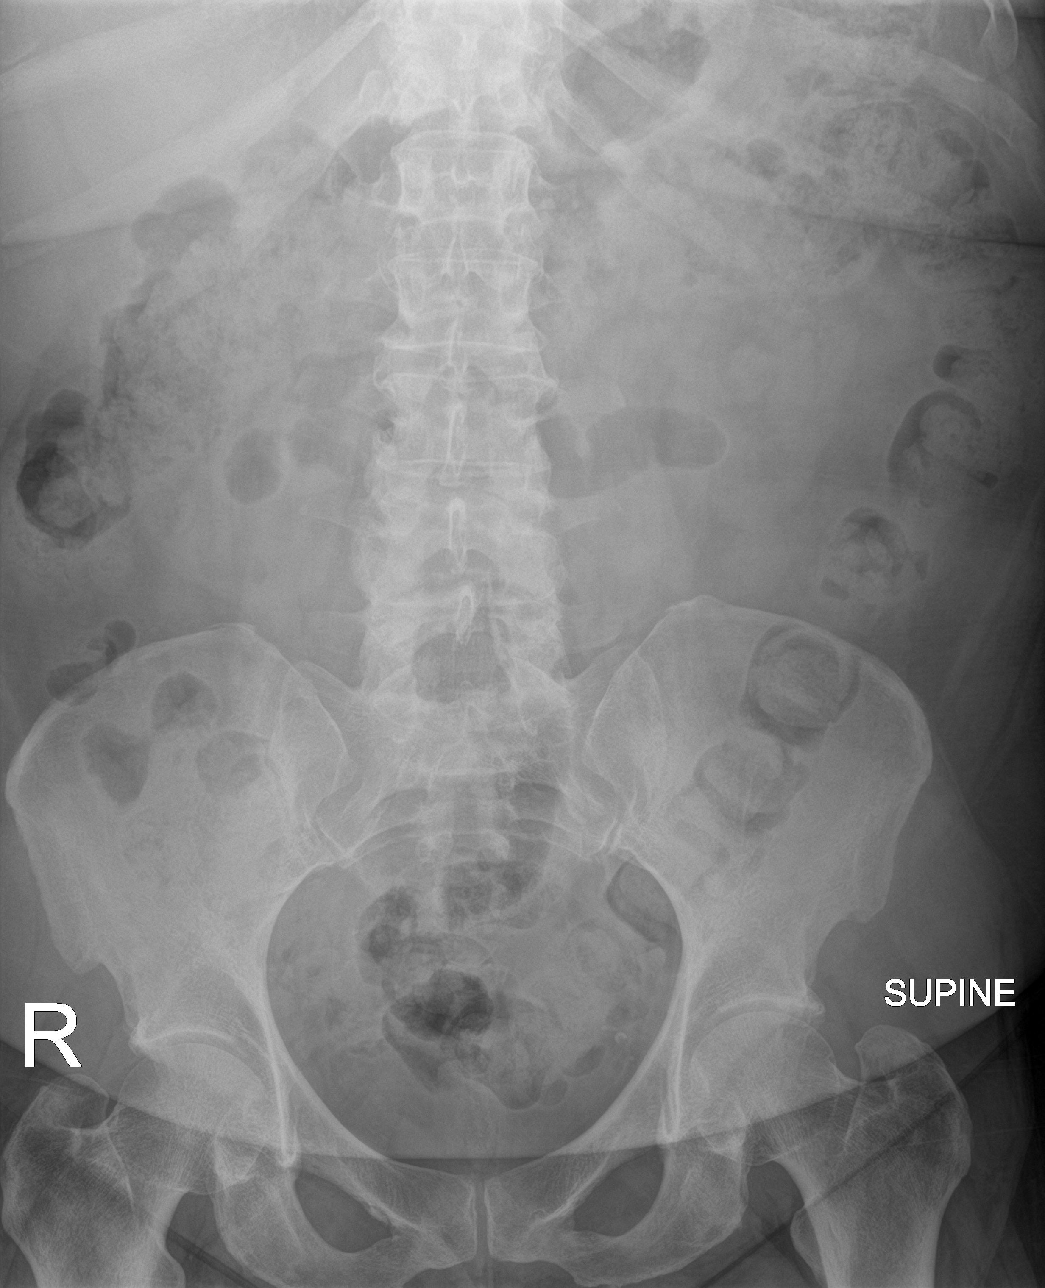

[3 of 3 positions shown; findings below may reference images not displayed]

FINDINGS: Mediastinum and hilar structures normal. Mild bilateral pulmonary
interstitial prominence. Although these changes may be chronic. An
active interstitial process including pneumonitis could not be
excluded. No pleural effusion or pneumothorax. Heart size normal.

Soft tissues of the abdomen are unremarkable. No bowel distention.
Stool noted throughout the colon. Pelvic calcifications consistent
with phleboliths. No acute bony abnormality.
IMPRESSION: 1. Mild bilateral from interstitial prominence. Although these
changes may be chronic, active interstitial process including
pneumonitis can not be excluded.

2.  No acute intra-abdominal abnormality identified.

## 2020-06-24 ENCOUNTER — Other Ambulatory Visit: Payer: Self-pay

## 2020-06-24 ENCOUNTER — Ambulatory Visit (INDEPENDENT_AMBULATORY_CARE_PROVIDER_SITE_OTHER): Payer: 59 | Admitting: Internal Medicine

## 2020-06-24 ENCOUNTER — Encounter: Payer: Self-pay | Admitting: Internal Medicine

## 2020-06-24 VITALS — BP 152/96 | HR 66 | Temp 98.4°F | Resp 16 | Ht 64.0 in | Wt 189.0 lb

## 2020-06-24 DIAGNOSIS — Z124 Encounter for screening for malignant neoplasm of cervix: Secondary | ICD-10-CM

## 2020-06-24 DIAGNOSIS — R911 Solitary pulmonary nodule: Secondary | ICD-10-CM

## 2020-06-24 DIAGNOSIS — Z0000 Encounter for general adult medical examination without abnormal findings: Secondary | ICD-10-CM | POA: Diagnosis not present

## 2020-06-24 DIAGNOSIS — I10 Essential (primary) hypertension: Secondary | ICD-10-CM

## 2020-06-24 DIAGNOSIS — R7303 Prediabetes: Secondary | ICD-10-CM | POA: Diagnosis not present

## 2020-06-24 DIAGNOSIS — M8949 Other hypertrophic osteoarthropathy, multiple sites: Secondary | ICD-10-CM

## 2020-06-24 DIAGNOSIS — M5442 Lumbago with sciatica, left side: Secondary | ICD-10-CM | POA: Diagnosis not present

## 2020-06-24 DIAGNOSIS — Z1231 Encounter for screening mammogram for malignant neoplasm of breast: Secondary | ICD-10-CM

## 2020-06-24 DIAGNOSIS — M159 Polyosteoarthritis, unspecified: Secondary | ICD-10-CM

## 2020-06-24 DIAGNOSIS — Z23 Encounter for immunization: Secondary | ICD-10-CM | POA: Diagnosis not present

## 2020-06-24 DIAGNOSIS — M170 Bilateral primary osteoarthritis of knee: Secondary | ICD-10-CM

## 2020-06-24 DIAGNOSIS — R001 Bradycardia, unspecified: Secondary | ICD-10-CM

## 2020-06-24 DIAGNOSIS — G8929 Other chronic pain: Secondary | ICD-10-CM

## 2020-06-24 DIAGNOSIS — M5136 Other intervertebral disc degeneration, lumbar region: Secondary | ICD-10-CM

## 2020-06-24 LAB — CBC WITH DIFFERENTIAL/PLATELET
Basophils Absolute: 0 10*3/uL (ref 0.0–0.1)
Basophils Relative: 0.3 % (ref 0.0–3.0)
Eosinophils Absolute: 0.2 10*3/uL (ref 0.0–0.7)
Eosinophils Relative: 3.4 % (ref 0.0–5.0)
HCT: 37.7 % (ref 36.0–46.0)
Hemoglobin: 12.6 g/dL (ref 12.0–15.0)
Lymphocytes Relative: 48.1 % — ABNORMAL HIGH (ref 12.0–46.0)
Lymphs Abs: 3.5 10*3/uL (ref 0.7–4.0)
MCHC: 33.4 g/dL (ref 30.0–36.0)
MCV: 86.3 fl (ref 78.0–100.0)
Monocytes Absolute: 0.4 10*3/uL (ref 0.1–1.0)
Monocytes Relative: 6.1 % (ref 3.0–12.0)
Neutro Abs: 3 10*3/uL (ref 1.4–7.7)
Neutrophils Relative %: 42.1 % — ABNORMAL LOW (ref 43.0–77.0)
Platelets: 398 10*3/uL (ref 150.0–400.0)
RBC: 4.37 Mil/uL (ref 3.87–5.11)
RDW: 14.8 % (ref 11.5–15.5)
WBC: 7.2 10*3/uL (ref 4.0–10.5)

## 2020-06-24 LAB — BASIC METABOLIC PANEL
BUN: 14 mg/dL (ref 6–23)
CO2: 27 mEq/L (ref 19–32)
Calcium: 9.4 mg/dL (ref 8.4–10.5)
Chloride: 104 mEq/L (ref 96–112)
Creatinine, Ser: 0.72 mg/dL (ref 0.40–1.20)
GFR: 89.4 mL/min (ref >60.00)
Glucose, Bld: 85 mg/dL (ref 70–99)
Potassium: 3.6 mEq/L (ref 3.5–5.1)
Sodium: 138 mEq/L (ref 135–145)

## 2020-06-24 LAB — URINALYSIS, ROUTINE W REFLEX MICROSCOPIC
Bilirubin Urine: NEGATIVE
Hgb urine dipstick: NEGATIVE
Ketones, ur: NEGATIVE
Leukocytes,Ua: NEGATIVE
Nitrite: NEGATIVE
RBC / HPF: NONE SEEN (ref 0–?)
Specific Gravity, Urine: 1.01 (ref 1.000–1.030)
Total Protein, Urine: NEGATIVE
Urine Glucose: NEGATIVE
Urobilinogen, UA: 0.2 (ref 0.0–1.0)
pH: 7 (ref 5.0–8.0)

## 2020-06-24 LAB — HEPATIC FUNCTION PANEL
ALT: 12 U/L (ref 0–35)
AST: 14 U/L (ref 0–37)
Albumin: 4.2 g/dL (ref 3.5–5.2)
Alkaline Phosphatase: 95 U/L (ref 39–117)
Bilirubin, Direct: 0.1 mg/dL (ref 0.0–0.3)
Total Bilirubin: 0.7 mg/dL (ref 0.2–1.2)
Total Protein: 7.4 g/dL (ref 6.0–8.3)

## 2020-06-24 LAB — HEMOGLOBIN A1C: Hgb A1c MFr Bld: 6 % (ref 4.6–6.5)

## 2020-06-24 LAB — TSH: TSH: 1.62 u[IU]/mL (ref 0.35–4.50)

## 2020-06-24 LAB — LIPID PANEL
Cholesterol: 189 mg/dL (ref 0–200)
HDL: 68.4 mg/dL (ref >39.00)
LDL Cholesterol: 103 mg/dL — ABNORMAL HIGH (ref 0–99)
NonHDL: 120.84
Total CHOL/HDL Ratio: 3
Triglycerides: 90 mg/dL (ref 0.0–149.0)
VLDL: 18 mg/dL (ref 0.0–40.0)

## 2020-06-24 MED ORDER — INDAPAMIDE 1.25 MG PO TABS: 1.2500 mg | ORAL_TABLET | Freq: Every day | ORAL | 0 refills | Status: DC

## 2020-06-24 MED ORDER — TRAMADOL HCL 50 MG PO TABS: 50.0000 mg | ORAL_TABLET | Freq: Four times a day (QID) | ORAL | 3 refills | Status: DC | PRN

## 2020-06-24 NOTE — Progress Notes (Signed)
Subjective:  Patient ID: Melanie Ewing, female    DOB: April 12, 1955  Age: 65 y.o. MRN: [id number]  CC: Hypertension and Annual Exam  This visit occurred during the SARS-CoV-2 public health emergency.  Safety protocols were in place, including screening questions prior to the visit, additional usage of staff PPE, and extensive cleaning of exam room while observing appropriate contact time as indicated for disinfecting solutions.    HPI [name] presents for a CPX and f/up -   She continues to complain of musculoskeletal pain.  She has decided not to take the NSAID because it was ineffective.  She is very active and denies any recent episodes of headache, blurred vision, diaphoresis, dizziness, lightheadedness, chest pain, shortness of breath, presyncope, or edema.  Outpatient Medications Prior to Visit  Medication Sig Dispense Refill   Multiple Vitamin (MULTIVITAMIN) tablet Take 1 tablet by mouth daily.     etodolac (LODINE) 400 MG tablet TAKE 1 TABLET(400 MG) BY MOUTH TWICE DAILY (Patient not taking: Reported on 06/24/2020) 180 tablet 0   No facility-administered medications prior to visit.    ROS Review of Systems  Constitutional:  Negative for chills, diaphoresis, fatigue and fever.  HENT: Negative.    Eyes: Negative.  Negative for visual disturbance.  Respiratory:  Negative for cough, chest tightness, shortness of breath and wheezing.   Cardiovascular:  Negative for chest pain, palpitations and leg swelling.  Gastrointestinal:  Negative for abdominal pain, constipation, diarrhea and nausea.  Genitourinary:  Negative for difficulty urinating.  Musculoskeletal:  Positive for arthralgias and back pain. Negative for joint swelling, myalgias and neck pain.  Skin: Negative.   Neurological:  Negative for dizziness, weakness, light-headedness and headaches.  Hematological:  Negative for adenopathy. Does not bruise/bleed easily.  Psychiatric/Behavioral: Negative.      Objective:  BP (!) 152/96 (BP Location: Left Arm, Patient Position: Sitting, Cuff Size: Large)   Pulse 66   Temp 98.4 F (36.9 C) (Oral)   Resp 16   Ht 5' 4"  (1.626 m)   Wt 189 lb (85.7 kg)   SpO2 94%   BMI 32.44 kg/m   BP Readings from Last 3 Encounters:  06/24/20 (!) 152/96  10/01/19 138/82  02/11/19 124/82    Wt Readings from Last 3 Encounters:  06/24/20 189 lb (85.7 kg)  10/01/19 188 lb (85.3 kg)  02/11/19 197 lb (89.4 kg)    Physical Exam Vitals reviewed.  HENT:     Nose: Nose normal.     Mouth/Throat:     Mouth: Mucous membranes are moist.  Eyes:     General: No scleral icterus.    Conjunctiva/sclera: Conjunctivae normal.  Cardiovascular:     Rate and Rhythm: Regular rhythm. Bradycardia present.     Heart sounds: Normal heart sounds, S1 normal and S2 normal. No murmur heard.    Comments: EKG- Sinus bradycardia, 50 bpm Otherwise normal EKG Pulmonary:     Effort: Pulmonary effort is normal.     Breath sounds: No stridor. No wheezing, rhonchi or rales.  Abdominal:     General: Abdomen is flat. Bowel sounds are normal. There is no distension.     Palpations: Abdomen is soft. There is no hepatomegaly, splenomegaly or mass.     Tenderness: There is no abdominal tenderness.  Musculoskeletal:        General: Deformity (DJD) present. No swelling.     Cervical back: Normal and neck supple.     Thoracic back: Normal.     Lumbar  back: Normal. Normal range of motion. Negative right straight leg raise test and negative left straight leg raise test.     Right lower leg: No edema.     Left lower leg: No edema.  Lymphadenopathy:     Cervical: No cervical adenopathy.  Skin:    General: Skin is warm.     Findings: No lesion or rash.  Neurological:     General: No focal deficit present.     Mental Status: She is alert. Mental status is at baseline.  Psychiatric:        Mood and Affect: Mood normal.        Behavior: Behavior normal.    Lab Results  Component  Value Date   WBC 7.2 06/24/2020   HGB 12.6 06/24/2020   HCT 37.7 06/24/2020   PLT 398.0 06/24/2020   GLUCOSE 85 06/24/2020   CHOL 189 06/24/2020   TRIG 90.0 06/24/2020   HDL 68.40 06/24/2020   LDLCALC 103 (H) 06/24/2020   ALT 12 06/24/2020   AST 14 06/24/2020   NA 138 06/24/2020   K 3.6 06/24/2020   CL 104 06/24/2020   CREATININE 0.72 06/24/2020   BUN 14 06/24/2020   CO2 27 06/24/2020   TSH 1.62 06/24/2020   HGBA1C 6.0 06/24/2020   MICROALBUR 0.8 10/05/2014    MM DIGITAL SCREENING BILATERAL  Result Date: 03/14/2019 CLINICAL DATA:  Screening. EXAM: DIGITAL SCREENING BILATERAL MAMMOGRAM WITH CAD COMPARISON:  Previous exam(s). ACR Breast Density Category b: There are scattered areas of fibroglandular density. FINDINGS: There are no findings suspicious for malignancy. Images were processed with CAD. IMPRESSION: No mammographic evidence of malignancy. A result letter of this screening mammogram will be mailed directly to the patient. RECOMMENDATION: Screening mammogram in one year. (Code:SM-B-01Y) BI-RADS CATEGORY  1: Negative. Electronically Signed   By: Franki Cabot M.D.   On: 03/14/2019 13:10    Assessment & Plan:   Nethra was seen today for hypertension and annual exam.  Diagnoses and all orders for this visit:  Routine general medical examination at a health care facility- Exam completed, labs reviewed-statin therapy is not indicated, vaccines reviewed and updated, cancer screenings addressed, patient education was given. -     Lipid panel; Future -     Lipid panel  Cervical cancer screening -     Ambulatory referral to Gynecology  Prediabetes- Her A1c is at 6.0%.  Medical therapy is not indicated. -     Basic metabolic panel; Future -     Hemoglobin A1c; Future -     Hemoglobin A1c -     Basic metabolic panel  Primary hypertension- Her blood pressure is not adequately well controlled.  Her EKG is reassuring.  Will check labs to screen for secondary causes and  endorgan damage.  I recommended that she treat this with a thiazide diuretic. -     EKG 12-Lead -     CBC with Differential/Platelet; Future -     Basic metabolic panel; Future -     TSH; Future -     Urinalysis, Routine w reflex microscopic; Future -     Hepatic function panel; Future -     Hepatic function panel -     Urinalysis, Routine w reflex microscopic -     TSH -     Basic metabolic panel -     CBC with Differential/Platelet -     indapamide (LOZOL) 1.25 MG tablet; Take 1 tablet (1.25 mg total) by mouth daily.  Need for prophylactic vaccination with combined diphtheria-tetanus-pertussis (DTP) vaccine -     Tdap vaccine greater than or equal to 7yo IM  Nodule of left lung -     CT Chest Wo Contrast; Future  Solitary pulmonary nodule -     CT Chest Wo Contrast; Future  Visit for screening mammogram -     MM DIGITAL SCREENING BILATERAL; Future  Chronic midline low back pain with left-sided sciatica -     traMADol (ULTRAM) 50 MG tablet; Take 1 tablet (50 mg total) by mouth every 6 (six) hours as needed.  DDD (degenerative disc disease), lumbar -     traMADol (ULTRAM) 50 MG tablet; Take 1 tablet (50 mg total) by mouth every 6 (six) hours as needed.  Primary osteoarthritis of both knees -     traMADol (ULTRAM) 50 MG tablet; Take 1 tablet (50 mg total) by mouth every 6 (six) hours as needed.  Primary osteoarthritis involving multiple joints -     traMADol (ULTRAM) 50 MG tablet; Take 1 tablet (50 mg total) by mouth every 6 (six) hours as needed.  I have discontinued Zurii A. Ellender's etodolac. I am also having her start on traMADol and indapamide. Additionally, I am having her maintain her multivitamin.  Meds ordered this encounter  Medications   traMADol (ULTRAM) 50 MG tablet    Sig: Take 1 tablet (50 mg total) by mouth every 6 (six) hours as needed.    Dispense:  90 tablet    Refill:  3   indapamide (LOZOL) 1.25 MG tablet    Sig: Take 1 tablet (1.25 mg total) by  mouth daily.    Dispense:  90 tablet    Refill:  0      Follow-up: Return in about 3 months (around 09/24/2020).  Scarlette Calico, MD

## 2020-06-24 NOTE — Patient Instructions (Signed)

## 2020-06-25 DIAGNOSIS — R001 Bradycardia, unspecified: Secondary | ICD-10-CM | POA: Insufficient documentation

## 2020-06-25 NOTE — Assessment & Plan Note (Signed)
She is asymptomatic with this.  Will check labs to screen for secondary causes.

## 2020-07-08 ENCOUNTER — Other Ambulatory Visit: Payer: Self-pay | Admitting: Internal Medicine

## 2020-07-08 DIAGNOSIS — M159 Polyosteoarthritis, unspecified: Secondary | ICD-10-CM

## 2020-07-08 DIAGNOSIS — M5412 Radiculopathy, cervical region: Secondary | ICD-10-CM

## 2020-07-08 DIAGNOSIS — M170 Bilateral primary osteoarthritis of knee: Secondary | ICD-10-CM

## 2020-07-08 DIAGNOSIS — M5136 Other intervertebral disc degeneration, lumbar region: Secondary | ICD-10-CM

## 2020-07-08 DIAGNOSIS — M8949 Other hypertrophic osteoarthropathy, multiple sites: Secondary | ICD-10-CM

## 2020-07-08 DIAGNOSIS — G8929 Other chronic pain: Secondary | ICD-10-CM

## 2020-07-27 ENCOUNTER — Ambulatory Visit (INDEPENDENT_AMBULATORY_CARE_PROVIDER_SITE_OTHER)
Admission: RE | Admit: 2020-07-27 | Discharge: 2020-07-27 | Disposition: A | Discharge status: Home / Self Care | Payer: 59 | Source: Ambulatory Visit | Attending: Internal Medicine | Admitting: Internal Medicine

## 2020-07-27 ENCOUNTER — Other Ambulatory Visit: Payer: Self-pay

## 2020-07-27 DIAGNOSIS — R911 Solitary pulmonary nodule: Secondary | ICD-10-CM | POA: Diagnosis not present

## 2020-07-28 ENCOUNTER — Other Ambulatory Visit: Payer: Self-pay | Admitting: Internal Medicine

## 2020-07-28 DIAGNOSIS — R911 Solitary pulmonary nodule: Secondary | ICD-10-CM

## 2020-08-19 ENCOUNTER — Ambulatory Visit
Admission: RE | Admit: 2020-08-19 | Discharge: 2020-08-19 | Disposition: A | Discharge status: Home / Self Care | Payer: 59 | Source: Ambulatory Visit | Attending: Internal Medicine | Admitting: Internal Medicine

## 2020-08-19 ENCOUNTER — Other Ambulatory Visit: Payer: Self-pay

## 2020-08-19 DIAGNOSIS — Z1231 Encounter for screening mammogram for malignant neoplasm of breast: Secondary | ICD-10-CM

## 2020-09-08 ENCOUNTER — Other Ambulatory Visit (HOSPITAL_COMMUNITY)
Admission: RE | Admit: 2020-09-08 | Discharge: 2020-09-08 | Disposition: A | Discharge status: Home / Self Care | Payer: 59 | Source: Ambulatory Visit | Attending: Nurse Practitioner | Admitting: Nurse Practitioner

## 2020-09-08 ENCOUNTER — Other Ambulatory Visit: Payer: Self-pay | Admitting: Internal Medicine

## 2020-09-08 ENCOUNTER — Other Ambulatory Visit: Payer: Self-pay

## 2020-09-08 ENCOUNTER — Encounter: Payer: Self-pay | Admitting: Nurse Practitioner

## 2020-09-08 ENCOUNTER — Ambulatory Visit (INDEPENDENT_AMBULATORY_CARE_PROVIDER_SITE_OTHER): Payer: 59 | Admitting: Nurse Practitioner

## 2020-09-08 VITALS — BP 124/82 | Ht 63.0 in | Wt 187.0 lb

## 2020-09-08 DIAGNOSIS — R911 Solitary pulmonary nodule: Secondary | ICD-10-CM

## 2020-09-08 DIAGNOSIS — Z780 Asymptomatic menopausal state: Secondary | ICD-10-CM

## 2020-09-08 DIAGNOSIS — Z01419 Encounter for gynecological examination (general) (routine) without abnormal findings: Secondary | ICD-10-CM | POA: Insufficient documentation

## 2020-09-08 NOTE — Progress Notes (Signed)
   Melanie Ewing 09-Mar-1955 [id number]   History:  65 y.o. [id number] presents for annual exam. No GYN complaints. Postmenopausal - no HRT, no bleeding. Normal pap and mammogram history.   Gynecologic History No LMP recorded. Patient is postmenopausal.   Contraception/Family planning: post menopausal status Sexually active: No  Health Maintenance Last Pap: 02/08/2016. Results were: Normal, 5-year repeat Last mammogram: 08/19/2020. Results were: Normal Last colonoscopy: 2011. Results were: Normal. Negative Cologuard 09/2019 Last Dexa: 02/22/2016. Results were: Normal  Past medical history, past surgical history, family history and social history were all reviewed and documented in the EPIC chart. 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   ROS:  A ROS was performed and pertinent positives and negatives are included.  Exam:  Vitals:   09/08/20 1349  BP: 124/82  Weight: 187 lb (84.8 kg)  Height: 5' 3"  (1.6 m)   Body mass index is 33.13 kg/m.  General appearance:  Normal Thyroid:  Symmetrical, normal in size, without palpable masses or nodularity. Respiratory  Auscultation:  Clear without wheezing or rhonchi Cardiovascular  Auscultation:  Regular rate, without rubs, murmurs or gallops  Edema/varicosities:  Not grossly evident Abdominal  Soft,nontender, without masses, guarding or rebound.  Liver/spleen:  No organomegaly noted  Hernia:  None appreciated  Skin  Inspection:  Grossly normal Breasts: Examined lying and sitting.   Right: Without masses, retractions, nipple discharge or axillary adenopathy.   Left: Without masses, retractions, nipple discharge or axillary adenopathy. Genitourinary   Inguinal/mons:  Normal without inguinal adenopathy  External genitalia:  Normal appearing vulva with no masses, tenderness, or lesions  BUS/Urethra/Skene's glands:  Normal  Vagina:  Atrophic changes  Cervix:  Normal appearing without discharge or lesions  Uterus:   Normal in size, shape and contour.  Midline and mobile, nontender  Adnexa/parametria:     Rt: Normal in size, without masses or tenderness.   Lt: Normal in size, without masses or tenderness.  Anus and perineum: Normal  Digital rectal exam: Normal sphincter tone without palpated masses or tenderness  Patient informed chaperone available to be present for breast and pelvic exam. Patient has requested no chaperone to be present. Patient has been advised what will be completed during breast and pelvic exam.   Assessment/Plan:  65 y.o. [id number] for annual exam.   Well female exam with routine gynecological exam - Plan: Cytology - PAP( [place]). Education provided on SBEs, importance of preventative screenings, current guidelines, high calcium diet, regular exercise, and multivitamin daily. Labs with PCP.   Postmenopausal - no HRT, no bleeding.   Screening for cervical cancer - Normal Pap history.  Pap with HR HPV today.   Screening for breast cancer - Normal mammogram history.  Continue annual screenings.  Normal breast exam today.  Screening for colon cancer - 2011 colonoscopy. Negative cologuard 09/2019. Will repeat at GI's recommended interval.   Screening for osteoporosis - Normal bone density in 2018. Will repeat next year per recommendation.   Return in 1 year for annual.   Tamela Gammon DNP, 2:16 PM 09/08/2020

## 2020-09-14 LAB — CYTOLOGY - PAP
Comment: NEGATIVE
Diagnosis: NEGATIVE
High risk HPV: NEGATIVE

## 2020-09-21 ENCOUNTER — Ambulatory Visit (INDEPENDENT_AMBULATORY_CARE_PROVIDER_SITE_OTHER): Payer: 59 | Admitting: Otolaryngology

## 2020-09-21 ENCOUNTER — Other Ambulatory Visit: Payer: Self-pay | Admitting: Internal Medicine

## 2020-09-21 ENCOUNTER — Other Ambulatory Visit: Payer: Self-pay

## 2020-09-21 DIAGNOSIS — K1121 Acute sialoadenitis: Secondary | ICD-10-CM | POA: Diagnosis not present

## 2020-09-21 DIAGNOSIS — L299 Pruritus, unspecified: Secondary | ICD-10-CM

## 2020-09-21 DIAGNOSIS — I10 Essential (primary) hypertension: Secondary | ICD-10-CM

## 2020-09-21 MED ORDER — CEPHALEXIN 500 MG PO CAPS: 500.0000 mg | ORAL_CAPSULE | Freq: Three times a day (TID) | ORAL | 1 refills | Status: DC

## 2020-09-21 NOTE — Progress Notes (Signed)
HPI: Melanie Ewing is a 65 y.o. female who returns today for evaluation of complaints of chronic itching in her ears especially on the right side.  She has not noted any hearing problems and no drainage from the ears but the ears itch all the time.  She has tried Diprolene 0.05% cream as well as oral eardrops without much benefit. She has also noticed recently some swelling in front of the ear in the region of the parotid gland..  Past Medical History:  Diagnosis Date   Arthritis    No past surgical history on file. Social History   Socioeconomic History   Marital status: Single    Spouse name: Not on file   Number of children: Not on file   Years of education: Not on file   Highest education level: Not on file  Occupational History   Occupation: CNA    Comment: [employer]  Tobacco Use   Smoking status: Former    Packs/day: 0.50    Years: 47.00    Pack years: 23.50    Types: Cigarettes    Start date: 74    Quit date: 03/09/2012    Years since quitting: 8.5   Smokeless tobacco: Never  Vaping Use   Vaping Use: Never used  Substance and Sexual Activity   Alcohol use: Yes    Comment: Social   Drug use: No   Sexual activity: Not Currently    Comment: 1st intercourse 65 yo-Fewer than 5 partners  Other Topics Concern   Not on file  Social History Narrative   Not on file   Social Determinants of Health   Financial Resource Strain: Not on file  Food Insecurity: Not on file  Transportation Needs: Not on file  Physical Activity: Not on file  Stress: Not on file  Social Connections: Not on file   Family History  Problem Relation Age of Onset   Alcohol abuse Mother    Heart disease Father    Heart disease Sister    Diabetes Sister    Cancer Neg Hx    COPD Neg Hx    Kidney disease Neg Hx    No Known Allergies Prior to Admission medications   Medication Sig Start Date End Date Taking? Authorizing Provider  Multiple Vitamin (MULTIVITAMIN) tablet Take 1 tablet  by mouth daily.    [provider]  traMADol (ULTRAM) 50 MG tablet Take 1 tablet (50 mg total) by mouth every 6 (six) hours as needed. 06/24/20   Janith Lima, MD     Positive ROS: Otherwise negative  All other systems have been reviewed and were otherwise negative with the exception of those mentioned in the HPI and as above.  Physical Exam: Constitutional: Alert, well-appearing, no acute distress Ears: External ears without lesions or tenderness. Ear canals are clear bilaterally.  There is no inflammatory changes of the ear canals and no drainage minimal wax buildup.  Both TMs are clear. Nasal: External nose without lesions. Septum with mild deformity and mild rhinitis. Clear nasal passages otherwise. Oral: Lips and gums without lesions. Tongue and palate mucosa without lesions. Posterior oropharynx clear.  Cannot really elicit any purulent discharge from the right parotid duct but the parotid gland was tender on compression. Neck: No palpable adenopathy or masses.  Diffuse swelling of the right parotid gland consistent with parotitis.  No significant adenopathy lower in the neck. Respiratory: Breathing comfortably  Skin: No facial/neck lesions or rash noted.  Procedures  Assessment: Acute right  parotitis Chronic itching of the ears.  Plan: Prescribed Keflex 500 mg 3 times daily for 10 days for her parotitis.  She will notify us if this does not improve. Concerning the itching in her ears recommended taking antihistamines and the steroid cream. She will follow-up as needed   Radene Journey, MD

## 2020-09-23 ENCOUNTER — Encounter (HOSPITAL_COMMUNITY): Payer: 59

## 2020-09-23 ENCOUNTER — Other Ambulatory Visit (INDEPENDENT_AMBULATORY_CARE_PROVIDER_SITE_OTHER): Payer: Self-pay | Admitting: Otolaryngology

## 2020-09-23 ENCOUNTER — Encounter (HOSPITAL_COMMUNITY): Admission: RE | Admit: 2020-09-23 | Payer: 59 | Source: Ambulatory Visit

## 2020-09-23 ENCOUNTER — Other Ambulatory Visit (HOSPITAL_COMMUNITY): Payer: 59

## 2020-09-23 MED ORDER — CEPHALEXIN 500 MG PO CAPS: 500.0000 mg | ORAL_CAPSULE | Freq: Three times a day (TID) | ORAL | 0 refills | Status: DC

## 2020-10-05 ENCOUNTER — Other Ambulatory Visit: Payer: Self-pay | Admitting: Internal Medicine

## 2020-10-05 ENCOUNTER — Telehealth: Payer: Self-pay | Admitting: Internal Medicine

## 2020-10-05 DIAGNOSIS — R911 Solitary pulmonary nodule: Secondary | ICD-10-CM

## 2020-10-05 NOTE — Telephone Encounter (Signed)
Patient would like for provider to submit new referral to Radiology for the PET Scan  Patient says she had to cancel previous appts bc she was not able to afford it but now she can & would like to make appt as soon as possible

## 2020-10-21 ENCOUNTER — Telehealth: Payer: Self-pay

## 2020-10-22 ENCOUNTER — Encounter (HOSPITAL_COMMUNITY)
Admission: RE | Admit: 2020-10-22 | Discharge: 2020-10-22 | Disposition: A | Discharge status: Home / Self Care | Payer: 59 | Source: Ambulatory Visit | Attending: Internal Medicine | Admitting: Internal Medicine

## 2020-10-22 ENCOUNTER — Other Ambulatory Visit: Payer: Self-pay

## 2020-10-22 DIAGNOSIS — R911 Solitary pulmonary nodule: Secondary | ICD-10-CM | POA: Insufficient documentation

## 2020-10-22 LAB — GLUCOSE, CAPILLARY: Glucose-Capillary: 107 mg/dL — ABNORMAL HIGH (ref 70–99)

## 2020-10-22 MED ORDER — FLUDEOXYGLUCOSE F - 18 (FDG) INJECTION
9.4000 | Freq: Once | INTRAVENOUS | Status: AC
  Administered 2020-10-22: 9.39 via INTRAVENOUS

## 2020-10-27 ENCOUNTER — Telehealth: Payer: Self-pay | Admitting: Internal Medicine

## 2020-10-27 NOTE — Telephone Encounter (Signed)
Patient returning call  Please call patient

## 2020-10-27 NOTE — Telephone Encounter (Signed)
Pt. Has alled again and is requesting return call from [place].    Callback #- [phone]

## 2020-10-28 NOTE — Telephone Encounter (Signed)
I have contacted the pt, she could not speak at this time. She will call the office back to discuss results.

## 2020-10-28 NOTE — Telephone Encounter (Signed)
Patient called back, she doesn't go back to work until 8pm, will keep her phone beside her to hear the call

## 2020-10-28 NOTE — Telephone Encounter (Signed)
See result note.  

## 2020-11-03 ENCOUNTER — Other Ambulatory Visit: Payer: Self-pay

## 2020-11-03 ENCOUNTER — Ambulatory Visit (INDEPENDENT_AMBULATORY_CARE_PROVIDER_SITE_OTHER): Payer: 59 | Admitting: Internal Medicine

## 2020-11-03 ENCOUNTER — Ambulatory Visit (INDEPENDENT_AMBULATORY_CARE_PROVIDER_SITE_OTHER): Payer: 59

## 2020-11-03 ENCOUNTER — Encounter: Payer: Self-pay | Admitting: Internal Medicine

## 2020-11-03 VITALS — BP 116/72 | HR 57 | Temp 97.8°F | Ht 63.0 in | Wt 191.0 lb

## 2020-11-03 DIAGNOSIS — M5412 Radiculopathy, cervical region: Secondary | ICD-10-CM

## 2020-11-03 DIAGNOSIS — M170 Bilateral primary osteoarthritis of knee: Secondary | ICD-10-CM

## 2020-11-03 DIAGNOSIS — M5442 Lumbago with sciatica, left side: Secondary | ICD-10-CM

## 2020-11-03 DIAGNOSIS — M5136 Other intervertebral disc degeneration, lumbar region: Secondary | ICD-10-CM

## 2020-11-03 DIAGNOSIS — M159 Polyosteoarthritis, unspecified: Secondary | ICD-10-CM

## 2020-11-03 DIAGNOSIS — M4802 Spinal stenosis, cervical region: Secondary | ICD-10-CM

## 2020-11-03 DIAGNOSIS — G8929 Other chronic pain: Secondary | ICD-10-CM

## 2020-11-03 MED ORDER — TRAMADOL HCL 50 MG PO TABS: 50.0000 mg | ORAL_TABLET | Freq: Four times a day (QID) | ORAL | 3 refills | Status: DC | PRN

## 2020-11-03 NOTE — Progress Notes (Signed)
Subjective:  Patient ID: [name], female    DOB: 1955-09-26  Age: 65 y.o. MRN: [id number]  CC: Neck Pain  This visit occurred during the SARS-CoV-2 public health emergency.  Safety protocols were in place, including screening questions prior to the visit, additional usage of staff PPE, and extensive cleaning of exam room while observing appropriate contact time as indicated for disinfecting solutions.    HPI Suetta A Doubek presents for f/up -  She complains of worsening neck pain that radiates into her right upper extremity.  She also has numbness and tingling in both hands.  She denies upper extremity weakness or incoordination.  She has not gotten much symptom relief with tramadol.  Outpatient Medications Prior to Visit  Medication Sig Dispense Refill   cephALEXin (KEFLEX) 500 MG capsule Take 1 capsule (500 mg total) by mouth 3 (three) times daily. Take 1 tablet 3 times a day for the next 10 days. 30 capsule 1   cephALEXin (KEFLEX) 500 MG capsule Take 1 capsule (500 mg total) by mouth 3 (three) times daily. 1 tablet 500 mg 3 times daily for 10 days 30 capsule 0   Multiple Vitamin (MULTIVITAMIN) tablet Take 1 tablet by mouth daily.     traMADol (ULTRAM) 50 MG tablet Take 1 tablet (50 mg total) by mouth every 6 (six) hours as needed. 90 tablet 3   No facility-administered medications prior to visit.    ROS Review of Systems  Constitutional:  Negative for chills, diaphoresis and fatigue.  HENT: Negative.    Eyes: Negative.   Respiratory:  Negative for cough, chest tightness and wheezing.   Cardiovascular:  Negative for chest pain, palpitations and leg swelling.  Gastrointestinal:  Negative for abdominal pain, diarrhea and nausea.  Endocrine: Negative.   Genitourinary: Negative.  Negative for difficulty urinating and dysuria.  Musculoskeletal:  Positive for arthralgias, back pain and neck pain. Negative for myalgias.  Skin: Negative.   Neurological:  Positive for  numbness. Negative for dizziness and weakness.  Hematological:  Negative for adenopathy. Does not bruise/bleed easily.  Psychiatric/Behavioral: Negative.     Objective:  BP 116/72   Pulse (!) 57   Temp 97.8 F (36.6 C) (Oral)   Ht 5' 3"  (1.6 m)   Wt 191 lb (86.6 kg)   SpO2 96%   BMI 33.83 kg/m   BP Readings from Last 3 Encounters:  11/03/20 116/72  09/08/20 124/82  06/24/20 (!) 152/96    Wt Readings from Last 3 Encounters:  11/03/20 191 lb (86.6 kg)  09/08/20 187 lb (84.8 kg)  06/24/20 189 lb (85.7 kg)    Physical Exam Vitals reviewed.  HENT:     Nose: Nose normal.     Mouth/Throat:     Mouth: Mucous membranes are moist.  Eyes:     General: No scleral icterus.    Conjunctiva/sclera: Conjunctivae normal.  Cardiovascular:     Rate and Rhythm: Normal rate and regular rhythm.     Pulses: Normal pulses.     Heart sounds: No murmur heard. Pulmonary:     Effort: Pulmonary effort is normal.     Breath sounds: No stridor. No wheezing, rhonchi or rales.  Abdominal:     General: Abdomen is flat.     Palpations: There is no mass.     Tenderness: There is no abdominal tenderness. There is no guarding.     Hernia: No hernia is present.  Musculoskeletal:        General: Normal range  of motion.     Cervical back: Normal range of motion and neck supple. No swelling, rigidity, spasms, tenderness, bony tenderness or crepitus. No pain with movement.     Thoracic back: Normal.     Lumbar back: Normal.     Right lower leg: No edema.     Left lower leg: No edema.  Lymphadenopathy:     Cervical: No cervical adenopathy.  Skin:    General: Skin is warm and dry.  Neurological:     General: No focal deficit present.     Mental Status: She is alert. Mental status is at baseline.     Cranial Nerves: Cranial nerves 2-12 are intact.     Sensory: Sensation is intact.     Motor: Motor function is intact.     Coordination: Coordination is intact.     Deep Tendon Reflexes: Reflexes  normal.     Reflex Scores:      Tricep reflexes are 0 on the right side and 0 on the left side.      Bicep reflexes are 0 on the right side and 0 on the left side.      Brachioradialis reflexes are 0 on the right side and 0 on the left side.      Patellar reflexes are 0 on the right side and 0 on the left side.      Achilles reflexes are 0 on the right side and 0 on the left side. Psychiatric:        Mood and Affect: Mood normal.        Behavior: Behavior normal.    Lab Results  Component Value Date   WBC 7.2 06/24/2020   HGB 12.6 06/24/2020   HCT 37.7 06/24/2020   PLT 398.0 06/24/2020   GLUCOSE 85 06/24/2020   CHOL 189 06/24/2020   TRIG 90.0 06/24/2020   HDL 68.40 06/24/2020   LDLCALC 103 (H) 06/24/2020   ALT 12 06/24/2020   AST 14 06/24/2020   NA 138 06/24/2020   K 3.6 06/24/2020   CL 104 06/24/2020   CREATININE 0.72 06/24/2020   BUN 14 06/24/2020   CO2 27 06/24/2020   TSH 1.62 06/24/2020   HGBA1C 6.0 06/24/2020   MICROALBUR 0.8 10/05/2014    NM PET Image Initial (PI) Skull Base To Thigh (F-18 FDG)  Result Date: 10/25/2020 CLINICAL DATA:  Subsequent treatment strategy for lung nodule. EXAM: NUCLEAR MEDICINE PET SKULL BASE TO THIGH TECHNIQUE: 9.4 mCi F-18 FDG was injected intravenously. Full-ring PET imaging was performed from the skull base to thigh after the radiotracer. CT data was obtained and used for attenuation correction and anatomic localization. Fasting blood glucose: 107 mg/dl COMPARISON:  Chest CT 07/27/2020 FINDINGS: Mediastinal blood pool activity: SUV max 2.8 Liver activity: SUV max NA NECK: Asymmetric hypermetabolism noted along the mucosa of the right nasopharynx ( SUV max = 5.2. Symmetric uptake identified in the tonsillar regions bilaterally ( SUV max = 7.5). No mass lesion visible at any of the hypermetabolic locations. Incidental CT findings: none CHEST: No hypermetabolic mediastinal or hilar nodes.1.5 cm nodular density in the lingula identified on  previous chest CT is not hypermetabolic ( SUV max = 1.1). Incidental CT findings: none ABDOMEN/PELVIS: No abnormal hypermetabolic activity within the liver, pancreas, adrenal glands, or spleen. No hypermetabolic lymph nodes in the abdomen or pelvis. Incidental CT findings: Moderate atherosclerotic calcification noted in the abdominal aorta. Diffuse diverticular disease noted in the colon. SKELETON: No focal hypermetabolic activity to suggest skeletal  metastasis. Incidental CT findings: none IMPRESSION: 1. 1.5 cm nodular density in the lingula identified on previous CT scan shows no hypermetabolism. While reassuring, low-grade or well differentiated neoplasm can be poorly FDG avid. Continued close attention recommended. 2. Symmetric hypermetabolism in the tonsillar regions with asymmetric hypermetabolic FDG uptake in the right posterior nasopharynx. No underlying lesion evident on noncontrast CT imaging. Findings may reflect infectious/inflammatory etiology although given the asymmetry in the nasopharynx, direct visualization may be warranted. Electronically Signed   By: Misty Stanley M.D.   On: 10/25/2020 09:19   NM PET SUPER D CT  Result Date: 10/25/2020 CLINICAL DATA:  Follow-up lung nodule EXAM: CT CHEST WITHOUT CONTRAST TECHNIQUE: Multidetector CT imaging of the chest was performed using thin slice collimation for electromagnetic bronchoscopy planning purposes, without intravenous contrast. COMPARISON:  07/27/2020 FINDINGS: Cardiovascular: Scattered aortic normal heart size. No pericardial effusion. Mediastinum/Nodes: No enlarged mediastinal, hilar, or axillary lymph nodes. Thyroid gland, trachea, and esophagus demonstrate no significant findings. Lungs/Pleura: Unchanged, possibly spiculated nodule of the lingula measuring 1.5 x 0.9 cm (series 11, image 105). New clustered nodularity of the peripheral lingula measuring 1.1 x 0.7 cm in total (series 11, image 91 no pleural effusion or pneumothorax. Upper  Abdomen: No acute abnormality. Musculoskeletal: No chest wall mass or suspicious bone lesions identified. IMPRESSION: 1. Unchanged, possibly spiculated nodule of the lingula measuring 1.5 x 0.9 cm. This remains suspicious for malignancy. 2. New clustered nodularity of the peripheral lingula measuring 1.1 x 0.7 cm in total, almost certainly infectious or inflammatory given interval development. Attention on follow-up. Electronically Signed   By: Delanna Ahmadi M.D.   On: 10/25/2020 09:25   FINDINGS: The cervical spine is visualized from the occiput to the cervicothoracic junction. Straightening of the normal cervical lordosis. Alignment is otherwise anatomic. Vertebral body height is maintained. Prevertebral soft tissues are within normal limits.   Uncovertebral hypertrophy, endplate degenerative changes and anterior marginal osteophytosis are seen predominantly at C4-5, C5-6 and C6-7. Multilevel facet sclerosis. Right-sided neural foraminal narrowing at C3-4. Left neural foramina appear grossly patent. Visualized lung apices are grossly clear.   IMPRESSION: 1. Straightening of the normal cervical lordosis. 2. Degenerative disc disease and facet sclerosis from C4-5 to C6-7.     Electronically Signed   By: Lorin Picket M.D.   On: 11/03/2020 14:56  Assessment & Plan:   Gwenda was seen today for neck pain.  Diagnoses and all orders for this visit:  Radiculitis of right cervical region -     DG Cervical Spine Complete; Future -     MR Cervical Spine Wo Contrast; Future -     etodolac (LODINE XL) 400 MG 24 hr tablet; Take 1 tablet (400 mg total) by mouth daily.  Chronic midline low back pain with left-sided sciatica -     traMADol (ULTRAM) 50 MG tablet; Take 1 tablet (50 mg total) by mouth every 6 (six) hours as needed. -     etodolac (LODINE XL) 400 MG 24 hr tablet; Take 1 tablet (400 mg total) by mouth daily.  DDD (degenerative disc disease), lumbar -     traMADol (ULTRAM) 50  MG tablet; Take 1 tablet (50 mg total) by mouth every 6 (six) hours as needed. -     etodolac (LODINE XL) 400 MG 24 hr tablet; Take 1 tablet (400 mg total) by mouth daily.  Primary osteoarthritis of both knees -     traMADol (ULTRAM) 50 MG tablet; Take 1 tablet (50 mg total) by  mouth every 6 (six) hours as needed. -     etodolac (LODINE XL) 400 MG 24 hr tablet; Take 1 tablet (400 mg total) by mouth daily.  Primary osteoarthritis involving multiple joints -     traMADol (ULTRAM) 50 MG tablet; Take 1 tablet (50 mg total) by mouth every 6 (six) hours as needed. -     etodolac (LODINE XL) 400 MG 24 hr tablet; Take 1 tablet (400 mg total) by mouth daily.  Foraminal stenosis of cervical region- I have recommended an MRI to see is this could be treated surgically. -     MR Cervical Spine Wo Contrast; Future -     etodolac (LODINE XL) 400 MG 24 hr tablet; Take 1 tablet (400 mg total) by mouth daily.  I am having Payal A. Swango start on etodolac. I am also having her maintain her multivitamin, cephALEXin, cephALEXin, and traMADol.  Meds ordered this encounter  Medications   traMADol (ULTRAM) 50 MG tablet    Sig: Take 1 tablet (50 mg total) by mouth every 6 (six) hours as needed.    Dispense:  90 tablet    Refill:  3   etodolac (LODINE XL) 400 MG 24 hr tablet    Sig: Take 1 tablet (400 mg total) by mouth daily.    Dispense:  90 tablet    Refill:  0     Follow-up: Return in about 3 months (around 02/03/2021).  Scarlette Calico, MD

## 2020-11-03 NOTE — Patient Instructions (Signed)
Osteoarthritis Osteoarthritis is a type of arthritis. It refers to joint pain or joint disease. Osteoarthritis affects tissue that covers the ends of bones in joints (cartilage). Cartilage acts as a cushion between the bones and helps them move smoothly. Osteoarthritis occurs when cartilage in the joints gets worn down. Osteoarthritis is sometimes called "wear and tear" arthritis. Osteoarthritis is the most common form of arthritis. It often occurs in older people. It is a condition that gets worse over time. The joints most often affected by this condition are in the fingers, toes, hips, knees, and spine, including the neck and lower back. What are the causes? This condition is caused by the wearing down of cartilage that covers the ends of bones. What increases the risk? The following factors may make you more likely to develop this condition: Being age 50 or older. Obesity. Overuse of joints. Past injury of a joint. Past surgery on a joint. Family history of osteoarthritis. What are the signs or symptoms? The main symptoms of this condition are pain, swelling, and stiffness in the joint. Other symptoms may include: An enlarged joint. More pain and further damage caused by small pieces of bone or cartilage that break off and float inside of the joint. Small deposits of bone (osteophytes) that grow on the edges of the joint. A grating or scraping feeling inside the joint when you move it. Popping or creaking sounds when you move. Difficulty walking or exercising. An inability to grip items, twist your hand(s), or control the movements of your hands and fingers. How is this diagnosed? This condition may be diagnosed based on: Your medical history. A physical exam. Your symptoms. X-rays of the affected joint(s). Blood tests to rule out other types of arthritis. How is this treated? There is no cure for this condition, but treatment can help control pain and improve joint function.  Treatment may include a combination of therapies, such as: Pain relief techniques, such as: Applying heat and cold to the joint. Massage. A form of talk therapy called cognitive behavioral therapy (CBT). This therapy helps you set goals and follow up on the changes that you make. Medicines for pain and inflammation. The medicines can be taken by mouth or applied to the skin. They include: NSAIDs, such as ibuprofen. Prescription medicines. Strong anti-inflammatory medicines (corticosteroids). Certain nutritional supplements. A prescribed exercise program. You may work with a physical therapist. Assistive devices, such as a brace, wrap, splint, specialized glove, or cane. A weight control plan. Surgery, such as: An osteotomy. This is done to reposition the bones and relieve pain or to remove loose pieces of bone and cartilage. Joint replacement surgery. You may need this surgery if you have advanced osteoarthritis. Follow these instructions at home: Activity Rest your affected joints as told by your health care provider. Exercise as told by your health care provider. He or she may recommend specific types of exercise, such as: Strengthening exercises. These are done to strengthen the muscles that support joints affected by arthritis. Aerobic activities. These are exercises, such as brisk walking or water aerobics, that increase your heart rate. Range-of-motion activities. These help your joints move more easily. Balance and agility exercises. Managing pain, stiffness, and swelling   If directed, apply heat to the affected area as often as told by your health care provider. Use the heat source that your health care provider recommends, such as a moist heat pack or a heating pad. If you have a removable assistive device, remove it as told by   your health care provider. Place a towel between your skin and the heat source. If your health care provider tells you to keep the assistive device on  while you apply heat, place a towel between the assistive device and the heat source. Leave the heat on for 20-30 minutes. Remove the heat if your skin turns bright red. This is especially important if you are unable to feel pain, heat, or cold. You may have a greater risk of getting burned. If directed, put ice on the affected area. To do this: If you have a removable assistive device, remove it as told by your health care provider. Put ice in a plastic bag. Place a towel between your skin and the bag. If your health care provider tells you to keep the assistive device on during icing, place a towel between the assistive device and the bag. Leave the ice on for 20 minutes, 2-3 times a day. Move your fingers or toes often to reduce stiffness and swelling. Raise (elevate) the injured area above the level of your heart while you are sitting or lying down. General instructions Take over-the-counter and prescription medicines only as told by your health care provider. Maintain a healthy weight. Follow instructions from your health care provider for weight control. Do not use any products that contain nicotine or tobacco, such as cigarettes, e-cigarettes, and chewing tobacco. If you need help quitting, ask your health care provider. Use assistive devices as told by your health care provider. Keep all follow-up visits as told by your health care provider. This is important. Where to find more information National Institute of Arthritis and Musculoskeletal and Skin Diseases: www.niams.nih.gov National Institute on Aging: www.nia.nih.gov American College of Rheumatology: www.rheumatology.org Contact a health care provider if: You have redness, swelling, or a feeling of warmth in a joint that gets worse. You have a fever along with joint or muscle aches. You develop a rash. You have trouble doing your normal activities. Get help right away if: You have pain that gets worse and is not relieved by  pain medicine. Summary Osteoarthritis is a type of arthritis that affects tissue covering the ends of bones in joints (cartilage). This condition is caused by the wearing down of cartilage that covers the ends of bones. The main symptom of this condition is pain, swelling, and stiffness in the joint. There is no cure for this condition, but treatment can help control pain and improve joint function. This information is not intended to replace advice given to you by your health care provider. Make sure you discuss any questions you have with your health care provider. Document Revised: 12/16/2018 Document Reviewed: 12/16/2018 Elsevier Patient Education  2022 Elsevier Inc.  

## 2020-11-05 DIAGNOSIS — M5412 Radiculopathy, cervical region: Secondary | ICD-10-CM | POA: Insufficient documentation

## 2020-11-05 DIAGNOSIS — M4802 Spinal stenosis, cervical region: Secondary | ICD-10-CM | POA: Insufficient documentation

## 2020-11-07 MED ORDER — ETODOLAC ER 400 MG PO TB24: 400.0000 mg | ORAL_TABLET | Freq: Every day | ORAL | 0 refills | Status: DC

## 2020-12-01 ENCOUNTER — Other Ambulatory Visit: Payer: 59

## 2020-12-10 ENCOUNTER — Ambulatory Visit
Admission: RE | Admit: 2020-12-10 | Discharge: 2020-12-10 | Disposition: A | Discharge status: Home / Self Care | Payer: 59 | Source: Ambulatory Visit | Attending: Internal Medicine | Admitting: Internal Medicine

## 2020-12-10 ENCOUNTER — Other Ambulatory Visit: Payer: Self-pay

## 2020-12-10 DIAGNOSIS — M5412 Radiculopathy, cervical region: Secondary | ICD-10-CM

## 2020-12-10 DIAGNOSIS — M4802 Spinal stenosis, cervical region: Secondary | ICD-10-CM

## 2020-12-11 ENCOUNTER — Other Ambulatory Visit: Payer: Self-pay | Admitting: Internal Medicine

## 2020-12-11 DIAGNOSIS — G992 Myelopathy in diseases classified elsewhere: Secondary | ICD-10-CM

## 2020-12-11 DIAGNOSIS — M4802 Spinal stenosis, cervical region: Secondary | ICD-10-CM | POA: Insufficient documentation

## 2021-01-10 ENCOUNTER — Other Ambulatory Visit: Payer: Self-pay | Admitting: Internal Medicine

## 2021-01-10 DIAGNOSIS — R911 Solitary pulmonary nodule: Secondary | ICD-10-CM

## 2021-02-21 ENCOUNTER — Other Ambulatory Visit: Payer: Self-pay

## 2021-02-21 ENCOUNTER — Ambulatory Visit (INDEPENDENT_AMBULATORY_CARE_PROVIDER_SITE_OTHER): Payer: Managed Care, Other (non HMO) | Admitting: Internal Medicine

## 2021-02-21 ENCOUNTER — Encounter: Payer: Self-pay | Admitting: Internal Medicine

## 2021-02-21 VITALS — BP 100/68 | HR 73 | Temp 98.4°F | Ht 63.0 in | Wt 195.0 lb

## 2021-02-21 DIAGNOSIS — L299 Pruritus, unspecified: Secondary | ICD-10-CM | POA: Insufficient documentation

## 2021-02-21 DIAGNOSIS — R059 Cough, unspecified: Secondary | ICD-10-CM | POA: Insufficient documentation

## 2021-02-21 DIAGNOSIS — R051 Acute cough: Secondary | ICD-10-CM

## 2021-02-21 DIAGNOSIS — R911 Solitary pulmonary nodule: Secondary | ICD-10-CM

## 2021-02-21 DIAGNOSIS — R7303 Prediabetes: Secondary | ICD-10-CM | POA: Diagnosis not present

## 2021-02-21 DIAGNOSIS — R918 Other nonspecific abnormal finding of lung field: Secondary | ICD-10-CM

## 2021-02-21 MED ORDER — HYDROCODONE BIT-HOMATROP MBR 5-1.5 MG/5ML PO SOLN: 5.0000 mL | Freq: Four times a day (QID) | ORAL | 0 refills | Status: AC | PRN

## 2021-02-21 MED ORDER — LEVOFLOXACIN 500 MG PO TABS: 500.0000 mg | ORAL_TABLET | Freq: Every day | ORAL | 0 refills | Status: AC

## 2021-02-21 MED ORDER — TRIAMCINOLONE ACETONIDE 0.1 % EX CREA: 1.0000 "application " | TOPICAL_CREAM | Freq: Two times a day (BID) | CUTANEOUS | 0 refills | Status: AC

## 2021-02-21 NOTE — Progress Notes (Signed)
Patient ID: Melanie Ewing, female   DOB: [birth year], 66 y.o.   MRN: [id number]        Chief Complaint: follow up [place] after a party 2 wks ago       HPI:  Melanie Ewing is a 66 y.o. female Here with acute onset mild to mod 2 wks ST, HA, general weakness and malaise, with prod cough greenish sputum, but Pt denies chest pain, increased sob or doe, wheezing, orthopnea, PND, increased LE swelling, palpitations, dizziness or syncope.   COVID neg yesterday at home.  Also has ear itching that really bothers her at night, cant sleep.   Also, For some reason CT chest ordered jan 2023 not done in f/u for pulmonary nodule with spiculation.  Quit smoking x 10 yrs except for 1 cigarette from her sister x 2 wks ago Wt Readings from Last 3 Encounters:  02/21/21 195 lb (88.5 kg)  11/03/20 191 lb (86.6 kg)  09/08/20 187 lb (84.8 kg)   BP Readings from Last 3 Encounters:  02/21/21 100/68  11/03/20 116/72  09/08/20 124/82         Past Medical History:  Diagnosis Date   Arthritis    History reviewed. No pertinent surgical history.  reports that she quit smoking about 8 years ago. Her smoking use included cigarettes. She started smoking about 46 years ago. She has a 23.50 pack-year smoking history. She has never used smokeless tobacco. She reports current alcohol use. She reports that she does not use drugs. family history includes Alcohol abuse in her mother; Diabetes in her sister; Heart disease in her father and sister. No Known Allergies Current Outpatient Medications on File Prior to Visit  Medication Sig Dispense Refill   Multiple Vitamin (MULTIVITAMIN) tablet Take 1 tablet by mouth daily.     etodolac (LODINE XL) 400 MG 24 hr tablet Take 1 tablet (400 mg total) by mouth daily. (Patient not taking: Reported on 02/21/2021) 90 tablet 0   traMADol (ULTRAM) 50 MG tablet Take 1 tablet (50 mg total) by mouth every 6 (six) hours as needed. (Patient not taking: Reported on 02/21/2021) 90 tablet 3   No  current facility-administered medications on file prior to visit.        ROS:  All others reviewed and negative.  Objective        PE:  BP 100/68 (BP Location: Right Arm, Patient Position: Sitting, Cuff Size: Large)    Pulse 73    Temp 98.4 F (36.9 C) (Oral)    Ht 5\' 3"  (1.6 m)    Wt 195 lb (88.5 kg)    SpO2 97%    BMI 34.54 kg/m                 Constitutional: Pt appears in NAD               HENT: Head: NCAT.                Right Ear: External ear normal.                 Left Ear: External ear normal. Bilat tm's with mild erythema.  Max sinus areas mild tender.  Pharynx with mild erythema, no exudate               Eyes: . Pupils are equal, round, and reactive to light. Conjunctivae and EOM are normal               Nose: without  d/c or deformity               Neck: Neck supple. Gross normal ROM               Cardiovascular: Normal rate and regular rhythm.                 Pulmonary/Chest: Effort normal and breath sounds without rales or wheezing.                Abd:  Soft, NT, ND, + BS, no organomegaly               Neurological: Pt is alert. At baseline orientation, motor grossly intact               Skin: Skin is warm. No rashes, no other new lesions, LE edema - none               Psychiatric: Pt behavior is normal without agitation   Micro: none  Cardiac tracings I have personally interpreted today:  none  Pertinent Radiological findings (summarize): none   Lab Results  Component Value Date   WBC 7.2 06/24/2020   HGB 12.6 06/24/2020   HCT 37.7 06/24/2020   PLT 398.0 06/24/2020   GLUCOSE 85 06/24/2020   CHOL 189 06/24/2020   TRIG 90.0 06/24/2020   HDL 68.40 06/24/2020   LDLCALC 103 (H) 06/24/2020   ALT 12 06/24/2020   AST 14 06/24/2020   NA 138 06/24/2020   K 3.6 06/24/2020   CL 104 06/24/2020   CREATININE 0.72 06/24/2020   BUN 14 06/24/2020   CO2 27 06/24/2020   TSH 1.62 06/24/2020   HGBA1C 6.0 06/24/2020   MICROALBUR 0.8 10/05/2014   Assessment/Plan:   Melanie Ewing is a 66 y.o. Black or African American [2] female with  has a past medical history of Arthritis.  Abnormal CT scan of lung Last CT with spiculation but no change in size, due for f/u and for reason jan 2023 order not done; d/w pt - will re-order  Cough Mild to mod, c/w bornchitis vs pna, for antibx course,  to f/u any worsening symptoms or concerns  Ear itching Mild, Ok for benadryl cr prn  Prediabetes Lab Results  Component Value Date   HGBA1C 6.0 06/24/2020   Stable, pt to continue current medical treatment  - diet   Solitary pulmonary nodule For f/u pulm nodule  Followup: Return if symptoms worsen or fail to improve.  Cathlean Cower, MD 02/27/2021 9:33 PM [facility]

## 2021-02-21 NOTE — Patient Instructions (Addendum)
Please take all new medication as prescribed - the antibiotic, cough medicine, and the steroid cream as needed for the ears  You can also try OTC Benadryl cream for the ear itching as well  Please no more smoking  Please continue all other medications as before, and refills have been done if requested.  Please have the pharmacy call with any other refills you may need.  Please keep your appointments with your specialists as you may have planned  You will be contacted regarding the referral for: CT chest scan

## 2021-02-27 ENCOUNTER — Encounter: Payer: Self-pay | Admitting: Internal Medicine

## 2021-02-27 NOTE — Assessment & Plan Note (Addendum)
Mild, Ok for benadryl cr prn

## 2021-02-27 NOTE — Assessment & Plan Note (Signed)
Lab Results  Component Value Date   HGBA1C 6.0 06/24/2020   Stable, pt to continue current medical treatment  - diet

## 2021-02-27 NOTE — Assessment & Plan Note (Addendum)
Mild to mod, c/w bornchitis vs pna, for antibx course,  to f/u any worsening symptoms or concerns

## 2021-02-27 NOTE — Assessment & Plan Note (Signed)
For f/u pulm nodule

## 2021-02-27 NOTE — Assessment & Plan Note (Signed)
Last CT with spiculation but no change in size, due for f/u and for reason jan 2023 order not done; d/w pt - will re-order

## 2021-03-24 ENCOUNTER — Other Ambulatory Visit: Payer: Managed Care, Other (non HMO)

## 2021-04-06 ENCOUNTER — Ambulatory Visit (INDEPENDENT_AMBULATORY_CARE_PROVIDER_SITE_OTHER): Payer: Managed Care, Other (non HMO) | Admitting: Internal Medicine

## 2021-04-06 ENCOUNTER — Ambulatory Visit (INDEPENDENT_AMBULATORY_CARE_PROVIDER_SITE_OTHER): Payer: Managed Care, Other (non HMO)

## 2021-04-06 ENCOUNTER — Encounter: Payer: Self-pay | Admitting: Internal Medicine

## 2021-04-06 VITALS — BP 132/76 | HR 84 | Temp 98.2°F | Resp 16 | Ht 63.0 in | Wt 196.0 lb

## 2021-04-06 DIAGNOSIS — R911 Solitary pulmonary nodule: Secondary | ICD-10-CM | POA: Diagnosis not present

## 2021-04-06 DIAGNOSIS — J22 Unspecified acute lower respiratory infection: Secondary | ICD-10-CM | POA: Insufficient documentation

## 2021-04-06 DIAGNOSIS — R918 Other nonspecific abnormal finding of lung field: Secondary | ICD-10-CM

## 2021-04-06 DIAGNOSIS — R058 Other specified cough: Secondary | ICD-10-CM

## 2021-04-06 DIAGNOSIS — R7303 Prediabetes: Secondary | ICD-10-CM

## 2021-04-06 DIAGNOSIS — J411 Mucopurulent chronic bronchitis: Secondary | ICD-10-CM | POA: Insufficient documentation

## 2021-04-06 MED ORDER — BREZTRI AEROSPHERE 160-9-4.8 MCG/ACT IN AERO: 2.0000 | INHALATION_SPRAY | Freq: Two times a day (BID) | RESPIRATORY_TRACT | 11 refills | Status: DC

## 2021-04-06 MED ORDER — AMOXICILLIN-POT CLAVULANATE 875-125 MG PO TABS
1.0000 | ORAL_TABLET | Freq: Two times a day (BID) | ORAL | 0 refills | Status: AC
Start: 2021-04-06 — End: 2021-04-16

## 2021-04-06 NOTE — Progress Notes (Signed)
? ?Subjective:  ?Patient ID: Melanie Ewing, female    DOB: September 11, 1955  Age: 66 y.o. MRN: [id number] ? ?CC: Cough ? ? ?HPI ?Melanie Ewing presents for f/up - ? ?She complains of a 86-monthhistory of cough productive of yellow phlegm with shortness of breath and wheezing.  She quit smoking about 10 years ago. ? ?Outpatient Medications Prior to Visit  ?Medication Sig Dispense Refill  ? etodolac (LODINE XL) 400 MG 24 hr tablet Take 1 tablet (400 mg total) by mouth daily. 90 tablet 0  ? Multiple Vitamin (MULTIVITAMIN) tablet Take 1 tablet by mouth daily.    ? traMADol (ULTRAM) 50 MG tablet Take 1 tablet (50 mg total) by mouth every 6 (six) hours as needed. 90 tablet 3  ? triamcinolone cream (KENALOG) 0.1 % Apply 1 application topically 2 (two) times daily. 30 g 0  ? ?No facility-administered medications prior to visit.  ? ? ?ROS ?Review of Systems  ?Constitutional:  Negative for chills, diaphoresis, fatigue and fever.  ?HENT: Negative.    ?Eyes: Negative.   ?Respiratory:  Positive for cough, chest tightness, shortness of breath and wheezing.   ?Cardiovascular:  Negative for chest pain, palpitations and leg swelling.  ?Gastrointestinal:  Negative for abdominal pain, constipation, diarrhea, nausea and vomiting.  ?Genitourinary:  Negative for difficulty urinating.  ?Musculoskeletal:  Positive for arthralgias. Negative for back pain and myalgias.  ?Skin: Negative.  Negative for color change and pallor.  ?Neurological:  Negative for dizziness, speech difficulty and weakness.  ?Hematological:  Negative for adenopathy. Does not bruise/bleed easily.  ?Psychiatric/Behavioral: Negative.    ? ?Objective:  ?BP 132/76 (BP Location: Right Arm, Patient Position: Sitting, Cuff Size: Normal)   Pulse 84   Temp 98.2 ?F (36.8 ?C) (Oral)   Resp 16   Ht 5' 3"  (1.6 m)   Wt 196 lb (88.9 kg)   SpO2 95%   BMI 34.72 kg/m?  ? ?BP Readings from Last 3 Encounters:  ?04/06/21 132/76  ?02/21/21 100/68  ?11/03/20 116/72  ? ? ?Wt  Readings from Last 3 Encounters:  ?04/06/21 196 lb (88.9 kg)  ?02/21/21 195 lb (88.5 kg)  ?11/03/20 191 lb (86.6 kg)  ? ? ?Physical Exam ?Vitals reviewed.  ?Constitutional:   ?   Appearance: She is not ill-appearing.  ?HENT:  ?   Mouth/Throat:  ?   Mouth: Mucous membranes are moist.  ?Eyes:  ?   General: No scleral icterus. ?   Conjunctiva/sclera: Conjunctivae normal.  ?Cardiovascular:  ?   Rate and Rhythm: Normal rate and regular rhythm.  ?   Heart sounds: No murmur heard. ?Pulmonary:  ?   Effort: Pulmonary effort is normal.  ?   Breath sounds: No stridor. No wheezing, rhonchi or rales.  ?Abdominal:  ?   General: Abdomen is protuberant. There is no distension.  ?   Palpations: There is no mass.  ?   Tenderness: There is no abdominal tenderness. There is no guarding.  ?   Hernia: No hernia is present.  ?Musculoskeletal:     ?   General: Normal range of motion.  ?   Cervical back: Neck supple.  ?   Right lower leg: No edema.  ?   Left lower leg: No edema.  ?Lymphadenopathy:  ?   Cervical: No cervical adenopathy.  ?Skin: ?   General: Skin is warm and dry.  ?Neurological:  ?   General: No focal deficit present.  ?   Mental Status: She is alert.  ?Psychiatric:     ?  Mood and Affect: Mood normal.     ?   Behavior: Behavior normal.  ? ? ?Lab Results  ?Component Value Date  ? WBC 7.2 06/24/2020  ? HGB 12.6 06/24/2020  ? HCT 37.7 06/24/2020  ? PLT 398.0 06/24/2020  ? GLUCOSE 85 06/24/2020  ? CHOL 189 06/24/2020  ? TRIG 90.0 06/24/2020  ? HDL 68.40 06/24/2020  ? LDLCALC 103 (H) 06/24/2020  ? ALT 12 06/24/2020  ? AST 14 06/24/2020  ? NA 138 06/24/2020  ? K 3.6 06/24/2020  ? CL 104 06/24/2020  ? CREATININE 0.72 06/24/2020  ? BUN 14 06/24/2020  ? CO2 27 06/24/2020  ? TSH 1.62 06/24/2020  ? HGBA1C 6.0 06/24/2020  ? MICROALBUR 0.8 10/05/2014  ? ? ?MR Cervical Spine Wo Contrast ? ?Result Date: 12/10/2020 ?CLINICAL DATA:  Initial evaluation for chronic neck pain with radiation to the right shoulder and upper extremity as well as  the right leg. EXAM: MRI CERVICAL SPINE WITHOUT CONTRAST TECHNIQUE: Multiplanar, multisequence MR imaging of the cervical spine was performed. No intravenous contrast was administered. COMPARISON:  Prior MRI from 03/10/2013. FINDINGS: Alignment: Straightening of the normal cervical lordosis. No listhesis. Vertebrae: Vertebral body height maintained without acute or chronic fracture. Bone marrow signal intensity within normal limits without discrete or worrisome osseous lesions. No abnormal marrow edema. Cord: Normal signal and morphology. Posterior Fossa, vertebral arteries, paraspinal tissues: Visualized brain and posterior fossa within normal limits. Craniocervical junction normal. Paraspinous and prevertebral soft tissues within normal limits. Normal intravascular flow voids seen within the vertebral arteries bilaterally. Disc levels: C2-C3: Shallow broad-based central disc protrusion indents the ventral thecal sac (series 9, image 5). Secondary minimal flattening of the ventral cord without cord signal changes. Mild spinal stenosis. Foramina remain patent. C3-C4: Broad-based posterior disc osteophyte flattens and effaces the ventral thecal sac. Superimposed mild facet and ligament flavum hypertrophy. Mild spinal stenosis. Moderate right with mild left C4 foraminal narrowing. C4-C5: Mild degenerative intervertebral disc space narrowing. Right eccentric disc osteophyte complex flattens and indents the ventral thecal sac. Mild cord flattening without cord signal changes. Mild spinal stenosis. Right worse than left uncovertebral and facet hypertrophy with resultant moderate right and mild left C5 foraminal stenosis. C5-C6: Mild degenerative intervertebral disc space narrowing. Broad-based right eccentric disc osteophyte flattens and effaces the ventral thecal sac. Secondary cord flattening without cord signal changes. Mild ligament flavum hypertrophy. Resultant moderate spinal stenosis. Severe right with moderate  left C6 foraminal narrowing. C6-C7: Broad-based left eccentric disc osteophyte flattens and effaces the ventral thecal sac. Mild cord flattening without cord signal changes. Mild facet hypertrophy. Resultant mild spinal stenosis. Moderate left with mild right C7 foraminal narrowing. C7-T1: Negative interspace. Moderate right with mild left facet hypertrophy. No spinal stenosis. Mild right C8 foraminal narrowing. Visualized upper thoracic spine demonstrates no significant finding. IMPRESSION: 1. Multilevel cervical spondylosis with resultant mild to moderate diffuse spinal stenosis at C2-3 through C6-7, most pronounced at C5-6. 2. Multifactorial degenerative changes with resultant multilevel foraminal narrowing as above. Notable findings include moderate right C4 and C5 foraminal stenosis, severe right with moderate left C6 foraminal narrowing, with moderate left C7 foraminal stenosis. Electronically Signed   By: Jeannine Boga M.D.   On: 12/10/2020 20:40  ? ?DG Chest 2 View ? ?Result Date: 04/06/2021 ?CLINICAL DATA:  cough EXAM: CHEST - 2 VIEW COMPARISON:  CT chest 10/22/2020 FINDINGS: Cardiomediastinal silhouette and pulmonary vasculature are within normal limits. Lungs are clear. Previously seen spiculated nodule in the lingula better evaluated on prior  CT. IMPRESSION: 1. No acute cardiopulmonary process. 2. Previously described left lung nodules better assessed on prior CT from 10/22/2020. Electronically Signed   By: Miachel Roux M.D.   On: 04/06/2021 15:16    ? ?Assessment & Plan:  ? ?Melanie Ewing was seen today for cough. ? ?Diagnoses and all orders for this visit: ? ?Prediabetes ? ?Cough productive of purulent sputum- Her chest x-ray is negative for mass or infiltrate. ?-     DG Chest 2 View; Future ?-     CT Chest Wo Contrast; Future ? ?Abnormal findings on diagnostic imaging of lung ?-     CT Chest Wo Contrast; Future ? ?Solitary pulmonary nodule ?-     CT Chest Wo Contrast; Future ? ?LRTI (lower  respiratory tract infection) ?-     amoxicillin-clavulanate (AUGMENTIN) 875-125 MG tablet; Take 1 tablet by mouth 2 (two) times daily for 10 days. ? ?Mucopurulent chronic bronchitis ([place])- I recommended that she start using a

## 2021-04-06 NOTE — Patient Instructions (Signed)

## 2021-08-05 ENCOUNTER — Encounter: Payer: Self-pay | Admitting: Family Medicine

## 2021-08-05 ENCOUNTER — Ambulatory Visit (INDEPENDENT_AMBULATORY_CARE_PROVIDER_SITE_OTHER): Payer: Commercial Managed Care - HMO | Admitting: Family Medicine

## 2021-08-05 VITALS — BP 110/80 | HR 59 | Temp 97.6°F | Ht 63.0 in | Wt 197.0 lb

## 2021-08-05 DIAGNOSIS — R053 Chronic cough: Secondary | ICD-10-CM | POA: Diagnosis not present

## 2021-08-05 DIAGNOSIS — R0982 Postnasal drip: Secondary | ICD-10-CM

## 2021-08-05 DIAGNOSIS — L299 Pruritus, unspecified: Secondary | ICD-10-CM | POA: Diagnosis not present

## 2021-08-05 DIAGNOSIS — J309 Allergic rhinitis, unspecified: Secondary | ICD-10-CM

## 2021-08-05 MED ORDER — LEVOCETIRIZINE DIHYDROCHLORIDE 5 MG PO TABS: 5.0000 mg | ORAL_TABLET | Freq: Every evening | ORAL | 2 refills | Status: AC

## 2021-08-05 NOTE — Patient Instructions (Signed)
Take Xyzal (allergy pill) daily as discussed.  Stay well-hydrated  If you continue having cough only at night, you can try an over-the-counter antacid such as Pepcid or omeprazole.

## 2021-08-05 NOTE — Progress Notes (Signed)
Subjective:  Melanie Ewing is a 66 y.o. female who presents for a dry cough since January. States her throat and ears itch. Coughing more at night.   Denies fever, chills, dizziness, chest pain, palpitations, shortness of breath, abdominal pain, N/V/D, urinary symptoms, LE edema.   No other aggravating or relieving factors.  No other c/o.  ROS as in subjective.   Objective: Vitals:   08/05/21 1433  BP: 110/80  Pulse: (!) 59  Temp: 97.6 F (36.4 C)  SpO2: 99%    General appearance: Alert, WD/WN, no distress, well appearing                             Skin: warm, no rash                           Head: no sinus tenderness                            Eyes: conjunctiva normal, corneas clear, PERRLA                            Ears: pearly TMs, external ear canals normal                          Nose: septum midline, turbinates not enlarged, with pale membranes             Mouth/throat: MMM, tongue normal, without pharyngeal erythema                           Neck: supple, no adenopathy, no thyromegaly, nontender                          Heart: RRR                         Lungs: CTA bilaterally, no wheezes, rales, or rhonchi      Assessment: Allergic rhinitis, unspecified seasonality, unspecified trigger - Plan: levocetirizine (XYZAL) 5 MG tablet  Ear itching  Post-nasal drainage  Persistent dry cough   Plan: Reviewed previous medical record showing that she has been treated for respiratory infections.  She has taken levofloxacin in the past.  Cough persists. Discussed diagnosis and treatment of allergic rhinitis and dry cough.  She will try Xyzal daily for the next 4 weeks.  Discussed she may also have mild GERD making her cough at night.  If she is not improving she may try an over-the-counter antacid such as Pepcid or omeprazole.  Follow-up if worsening

## 2021-09-21 ENCOUNTER — Ambulatory Visit (INDEPENDENT_AMBULATORY_CARE_PROVIDER_SITE_OTHER): Payer: Commercial Managed Care - HMO

## 2021-09-21 ENCOUNTER — Ambulatory Visit (INDEPENDENT_AMBULATORY_CARE_PROVIDER_SITE_OTHER): Payer: Commercial Managed Care - HMO | Admitting: Internal Medicine

## 2021-09-21 ENCOUNTER — Encounter: Payer: Self-pay | Admitting: Internal Medicine

## 2021-09-21 VITALS — BP 128/72 | HR 73 | Temp 98.0°F | Ht 63.0 in | Wt 198.0 lb

## 2021-09-21 DIAGNOSIS — M5442 Lumbago with sciatica, left side: Secondary | ICD-10-CM | POA: Diagnosis not present

## 2021-09-21 DIAGNOSIS — G8929 Other chronic pain: Secondary | ICD-10-CM

## 2021-09-21 DIAGNOSIS — M159 Polyosteoarthritis, unspecified: Secondary | ICD-10-CM

## 2021-09-21 DIAGNOSIS — Z0001 Encounter for general adult medical examination with abnormal findings: Secondary | ICD-10-CM

## 2021-09-21 DIAGNOSIS — R7303 Prediabetes: Secondary | ICD-10-CM

## 2021-09-21 DIAGNOSIS — J411 Mucopurulent chronic bronchitis: Secondary | ICD-10-CM

## 2021-09-21 DIAGNOSIS — M170 Bilateral primary osteoarthritis of knee: Secondary | ICD-10-CM | POA: Diagnosis not present

## 2021-09-21 DIAGNOSIS — R053 Chronic cough: Secondary | ICD-10-CM

## 2021-09-21 DIAGNOSIS — M5136 Other intervertebral disc degeneration, lumbar region: Secondary | ICD-10-CM | POA: Diagnosis not present

## 2021-09-21 DIAGNOSIS — R911 Solitary pulmonary nodule: Secondary | ICD-10-CM

## 2021-09-21 DIAGNOSIS — Z0000 Encounter for general adult medical examination without abnormal findings: Secondary | ICD-10-CM

## 2021-09-21 DIAGNOSIS — Z23 Encounter for immunization: Secondary | ICD-10-CM

## 2021-09-21 DIAGNOSIS — R918 Other nonspecific abnormal finding of lung field: Secondary | ICD-10-CM

## 2021-09-21 DIAGNOSIS — D75839 Thrombocytosis, unspecified: Secondary | ICD-10-CM

## 2021-09-21 MED ORDER — TRAMADOL HCL 50 MG PO TABS: 50.0000 mg | ORAL_TABLET | Freq: Four times a day (QID) | ORAL | 3 refills | Status: DC | PRN

## 2021-09-21 NOTE — Progress Notes (Unsigned)
Subjective:  Patient ID: Melanie Ewing, female    DOB: [birth year]  Age: 66 y.o. MRN: [id number]  CC: Annual Exam, Cough, and COPD   HPI Melanie Ewing presents for a CPX and f/up -   She complains of a 57-monthhistory of cough productive of yellow phlegm with fatigue.  She denies chest pain, shortness of breath, wheezing, fever, chills, or night sweats.  She also complains of chronic constipation.  Outpatient Medications Prior to Visit  Medication Sig Dispense Refill   etodolac (LODINE XL) 400 MG 24 hr tablet Take 1 tablet (400 mg total) by mouth daily. 90 tablet 0   Multiple Vitamin (MULTIVITAMIN) tablet Take 1 tablet by mouth daily.     Budeson-Glycopyrrol-Formoterol (BREZTRI AEROSPHERE) 160-9-4.8 MCG/ACT AERO Inhale 2 puffs into the lungs 2 (two) times daily. 10.7 g 11   traMADol (ULTRAM) 50 MG tablet Take 1 tablet (50 mg total) by mouth every 6 (six) hours as needed. 90 tablet 3   levocetirizine (XYZAL) 5 MG tablet Take 1 tablet (5 mg total) by mouth every evening. (Patient not taking: Reported on 09/21/2021) 30 tablet 2   triamcinolone cream (KENALOG) 0.1 % Apply 1 application topically 2 (two) times daily. (Patient not taking: Reported on 09/21/2021) 30 g 0   No facility-administered medications prior to visit.    ROS Review of Systems  Constitutional:  Positive for fatigue. Negative for appetite change, chills, diaphoresis, fever and unexpected weight change.  HENT: Negative.  Negative for sore throat.   Eyes: Negative.   Respiratory:  Positive for cough. Negative for chest tightness, shortness of breath and wheezing.   Cardiovascular:  Negative for chest pain, palpitations and leg swelling.  Gastrointestinal:  Positive for constipation. Negative for abdominal pain, diarrhea, nausea and vomiting.  Endocrine: Negative.   Genitourinary:  Negative for difficulty urinating.  Musculoskeletal:  Positive for arthralgias and back pain. Negative for myalgias.  Skin:  Negative.   Neurological:  Negative for dizziness, weakness and light-headedness.  Hematological:  Negative for adenopathy. Does not bruise/bleed easily.  Psychiatric/Behavioral: Negative.      Objective:  BP 128/72 (BP Location: Right Arm, Patient Position: Sitting, Cuff Size: Normal)   Pulse 73   Temp 98 F (36.7 C) (Oral)   Ht 5' 3"  (1.6 m)   Wt 198 lb (89.8 kg)   SpO2 99%   BMI 35.07 kg/m   BP Readings from Last 3 Encounters:  09/21/21 128/72  08/05/21 110/80  04/06/21 132/76    Wt Readings from Last 3 Encounters:  09/21/21 198 lb (89.8 kg)  08/05/21 197 lb (89.4 kg)  04/06/21 196 lb (88.9 kg)    Physical Exam Vitals reviewed.  Constitutional:      Appearance: She is not ill-appearing.  HENT:     Right Ear: Hearing, tympanic membrane, ear canal and external ear normal. Tympanic membrane is not injected.     Left Ear: Hearing, ear canal and external ear normal.  No middle ear effusion. Tympanic membrane is erythematous. Tympanic membrane is not injected or perforated.     Mouth/Throat:     Mouth: Mucous membranes are moist.  Eyes:     General: No scleral icterus.    Conjunctiva/sclera: Conjunctivae normal.  Cardiovascular:     Rate and Rhythm: Normal rate and regular rhythm.     Heart sounds: No murmur heard. Pulmonary:     Effort: Pulmonary effort is normal.     Breath sounds: No stridor. No wheezing, rhonchi or rales.  Abdominal:     General: Abdomen is flat.     Palpations: There is no mass.     Tenderness: There is no abdominal tenderness. There is no guarding.     Hernia: No hernia is present.  Musculoskeletal:        General: Normal range of motion.     Cervical back: Neck supple.     Right lower leg: No edema.     Left lower leg: No edema.  Lymphadenopathy:     Cervical: No cervical adenopathy.  Skin:    General: Skin is warm and dry.  Neurological:     General: No focal deficit present.     Mental Status: She is alert.  Psychiatric:         Mood and Affect: Mood normal.        Behavior: Behavior normal.     Lab Results  Component Value Date   WBC 7.5 09/21/2021   HGB 12.3 09/21/2021   HCT 37.7 09/21/2021   PLT 402.0 (H) 09/21/2021   GLUCOSE 178 (H) 09/21/2021   CHOL 181 09/21/2021   TRIG 139.0 09/21/2021   HDL 57.90 09/21/2021   LDLCALC 95 09/21/2021   ALT 11 09/21/2021   AST 15 09/21/2021   NA 140 09/21/2021   K 3.9 09/21/2021   CL 106 09/21/2021   CREATININE 0.85 09/21/2021   BUN 18 09/21/2021   CO2 26 09/21/2021   TSH 1.62 06/24/2020   HGBA1C 6.2 09/22/2021   MICROALBUR 0.8 10/05/2014    MR Cervical Spine Wo Contrast  Result Date: 12/10/2020 CLINICAL DATA:  Initial evaluation for chronic neck pain with radiation to the right shoulder and upper extremity as well as the right leg. EXAM: MRI CERVICAL SPINE WITHOUT CONTRAST TECHNIQUE: Multiplanar, multisequence MR imaging of the cervical spine was performed. No intravenous contrast was administered. COMPARISON:  Prior MRI from 03/10/2013. FINDINGS: Alignment: Straightening of the normal cervical lordosis. No listhesis. Vertebrae: Vertebral body height maintained without acute or chronic fracture. Bone marrow signal intensity within normal limits without discrete or worrisome osseous lesions. No abnormal marrow edema. Cord: Normal signal and morphology. Posterior Fossa, vertebral arteries, paraspinal tissues: Visualized brain and posterior fossa within normal limits. Craniocervical junction normal. Paraspinous and prevertebral soft tissues within normal limits. Normal intravascular flow voids seen within the vertebral arteries bilaterally. Disc levels: C2-C3: Shallow broad-based central disc protrusion indents the ventral thecal sac (series 9, image 5). Secondary minimal flattening of the ventral cord without cord signal changes. Mild spinal stenosis. Foramina remain patent. C3-C4: Broad-based posterior disc osteophyte flattens and effaces the ventral thecal sac.  Superimposed mild facet and ligament flavum hypertrophy. Mild spinal stenosis. Moderate right with mild left C4 foraminal narrowing. C4-C5: Mild degenerative intervertebral disc space narrowing. Right eccentric disc osteophyte complex flattens and indents the ventral thecal sac. Mild cord flattening without cord signal changes. Mild spinal stenosis. Right worse than left uncovertebral and facet hypertrophy with resultant moderate right and mild left C5 foraminal stenosis. C5-C6: Mild degenerative intervertebral disc space narrowing. Broad-based right eccentric disc osteophyte flattens and effaces the ventral thecal sac. Secondary cord flattening without cord signal changes. Mild ligament flavum hypertrophy. Resultant moderate spinal stenosis. Severe right with moderate left C6 foraminal narrowing. C6-C7: Broad-based left eccentric disc osteophyte flattens and effaces the ventral thecal sac. Mild cord flattening without cord signal changes. Mild facet hypertrophy. Resultant mild spinal stenosis. Moderate left with mild right C7 foraminal narrowing. C7-T1: Negative interspace. Moderate right with mild left facet hypertrophy. No  spinal stenosis. Mild right C8 foraminal narrowing. Visualized upper thoracic spine demonstrates no significant finding. IMPRESSION: 1. Multilevel cervical spondylosis with resultant mild to moderate diffuse spinal stenosis at C2-3 through C6-7, most pronounced at C5-6. 2. Multifactorial degenerative changes with resultant multilevel foraminal narrowing as above. Notable findings include moderate right C4 and C5 foraminal stenosis, severe right with moderate left C6 foraminal narrowing, with moderate left C7 foraminal stenosis. Electronically Signed   By: Jeannine Boga M.D.   On: 12/10/2020 20:40   No results found.   Assessment & Plan:   Mayley was seen today for annual exam, cough and copd.  Diagnoses and all orders for this visit:  Nodule of left lung  Chronic midline  low back pain with left-sided sciatica -     traMADol (ULTRAM) 50 MG tablet; Take 1 tablet (50 mg total) by mouth every 6 (six) hours as needed.  DDD (degenerative disc disease), lumbar -     traMADol (ULTRAM) 50 MG tablet; Take 1 tablet (50 mg total) by mouth every 6 (six) hours as needed.  Primary osteoarthritis of both knees -     traMADol (ULTRAM) 50 MG tablet; Take 1 tablet (50 mg total) by mouth every 6 (six) hours as needed.  Primary osteoarthritis involving multiple joints -     traMADol (ULTRAM) 50 MG tablet; Take 1 tablet (50 mg total) by mouth every 6 (six) hours as needed. -     Basic metabolic panel; Future -     Hepatic function panel; Future -     Hepatic function panel -     Basic metabolic panel  Mucopurulent chronic bronchitis ([facility]) -     CBC with Differential/Platelet; Future -     Hepatic function panel; Future -     Hepatic function panel -     CBC with Differential/Platelet -     umeclidinium-vilanterol (ANORO ELLIPTA) 62.5-25 MCG/ACT AEPB; Inhale 1 puff into the lungs daily at 6 (six) AM.  Prediabetes -     Basic metabolic panel; Future -     Hepatic function panel; Future -     Hemoglobin A1c; Future -     Hemoglobin A1c -     Hepatic function panel -     Basic metabolic panel  Chronic cough -     CBC with Differential/Platelet; Future -     Hepatic function panel; Future -     DG Chest 2 View; Future -     Hepatic function panel -     CBC with Differential/Platelet  Encounter for general adult medical examination with abnormal findings -     Lipid panel; Future -     Lipid panel  Thrombocytosis  Other orders -     Flu Vaccine QUAD 6+ mos PF IM (Fluarix Quad PF)   I have discontinued Benjamine Mola A. Pardon's Breztri Aerosphere. I am also having her start on umeclidinium-vilanterol. Additionally, I am having her maintain her multivitamin, etodolac, triamcinolone cream, levocetirizine, and traMADol.  Meds ordered this encounter  Medications    traMADol (ULTRAM) 50 MG tablet    Sig: Take 1 tablet (50 mg total) by mouth every 6 (six) hours as needed.    Dispense:  90 tablet    Refill:  3   umeclidinium-vilanterol (ANORO ELLIPTA) 62.5-25 MCG/ACT AEPB    Sig: Inhale 1 puff into the lungs daily at 6 (six) AM.    Dispense:  120 each    Refill:  1  Follow-up: Return in about 6 months (around 03/22/2022).  Scarlette Calico, MD

## 2021-09-21 NOTE — Patient Instructions (Signed)

## 2021-09-22 ENCOUNTER — Other Ambulatory Visit (INDEPENDENT_AMBULATORY_CARE_PROVIDER_SITE_OTHER): Payer: Commercial Managed Care - HMO

## 2021-09-22 DIAGNOSIS — R911 Solitary pulmonary nodule: Secondary | ICD-10-CM

## 2021-09-22 DIAGNOSIS — R7303 Prediabetes: Secondary | ICD-10-CM

## 2021-09-22 DIAGNOSIS — R918 Other nonspecific abnormal finding of lung field: Secondary | ICD-10-CM

## 2021-09-22 DIAGNOSIS — R058 Other specified cough: Secondary | ICD-10-CM

## 2021-09-22 LAB — LIPID PANEL
Cholesterol: 181 mg/dL (ref 0–200)
HDL: 57.9 mg/dL (ref >39.00)
LDL Cholesterol: 95 mg/dL (ref 0–99)
NonHDL: 122.92
Total CHOL/HDL Ratio: 3
Triglycerides: 139 mg/dL (ref 0.0–149.0)
VLDL: 27.8 mg/dL (ref 0.0–40.0)

## 2021-09-22 LAB — HEPATIC FUNCTION PANEL
ALT: 11 U/L (ref 0–35)
AST: 15 U/L (ref 0–37)
Albumin: 4 g/dL (ref 3.5–5.2)
Alkaline Phosphatase: 98 U/L (ref 39–117)
Bilirubin, Direct: 0.1 mg/dL (ref 0.0–0.3)
Total Bilirubin: 0.4 mg/dL (ref 0.2–1.2)
Total Protein: 7.4 g/dL (ref 6.0–8.3)

## 2021-09-22 LAB — CBC WITH DIFFERENTIAL/PLATELET
Basophils Absolute: 0.1 10*3/uL (ref 0.0–0.1)
Basophils Relative: 1.3 % (ref 0.0–3.0)
Eosinophils Absolute: 0.3 10*3/uL (ref 0.0–0.7)
Eosinophils Relative: 4.5 % (ref 0.0–5.0)
HCT: 37.7 % (ref 36.0–46.0)
Hemoglobin: 12.3 g/dL (ref 12.0–15.0)
Lymphocytes Relative: 43.7 % (ref 12.0–46.0)
Lymphs Abs: 3.3 10*3/uL (ref 0.7–4.0)
MCHC: 32.6 g/dL (ref 30.0–36.0)
MCV: 87.2 fl (ref 78.0–100.0)
Monocytes Absolute: 0.3 10*3/uL (ref 0.1–1.0)
Monocytes Relative: 3.9 % (ref 3.0–12.0)
Neutro Abs: 3.5 10*3/uL (ref 1.4–7.7)
Neutrophils Relative %: 46.6 % (ref 43.0–77.0)
Platelets: 402 10*3/uL — ABNORMAL HIGH (ref 150.0–400.0)
RBC: 4.32 Mil/uL (ref 3.87–5.11)
RDW: 15.5 % (ref 11.5–15.5)
WBC: 7.5 10*3/uL (ref 4.0–10.5)

## 2021-09-22 LAB — BASIC METABOLIC PANEL
BUN: 18 mg/dL (ref 6–23)
CO2: 26 mEq/L (ref 19–32)
Calcium: 9.4 mg/dL (ref 8.4–10.5)
Chloride: 106 mEq/L (ref 96–112)
Creatinine, Ser: 0.85 mg/dL (ref 0.40–1.20)
GFR: 72.61 mL/min (ref >60.00)
Glucose, Bld: 178 mg/dL — ABNORMAL HIGH (ref 70–99)
Potassium: 3.9 mEq/L (ref 3.5–5.1)
Sodium: 140 mEq/L (ref 135–145)

## 2021-09-24 DIAGNOSIS — Z0001 Encounter for general adult medical examination with abnormal findings: Secondary | ICD-10-CM | POA: Insufficient documentation

## 2021-09-24 DIAGNOSIS — D75839 Thrombocytosis, unspecified: Secondary | ICD-10-CM | POA: Insufficient documentation

## 2021-09-24 MED ORDER — UMECLIDINIUM-VILANTEROL 62.5-25 MCG/ACT IN AEPB
1.0000 | INHALATION_SPRAY | Freq: Every day | RESPIRATORY_TRACT | 1 refills | Status: AC
Start: 2021-09-24 — End: ?

## 2021-09-26 LAB — HEMOGLOBIN A1C: Hgb A1c MFr Bld: 6.2 % (ref 4.6–6.5)

## 2021-09-27 ENCOUNTER — Other Ambulatory Visit: Payer: Self-pay | Admitting: Internal Medicine

## 2021-09-27 DIAGNOSIS — Z1231 Encounter for screening mammogram for malignant neoplasm of breast: Secondary | ICD-10-CM

## 2021-10-24 ENCOUNTER — Ambulatory Visit: Payer: Commercial Managed Care - HMO

## 2021-10-26 ENCOUNTER — Other Ambulatory Visit: Payer: Commercial Managed Care - HMO

## 2022-03-09 ENCOUNTER — Other Ambulatory Visit: Payer: Self-pay

## 2022-03-09 ENCOUNTER — Emergency Department (HOSPITAL_BASED_OUTPATIENT_CLINIC_OR_DEPARTMENT_OTHER)
Admission: EM | Admit: 2022-03-09 | Discharge: 2022-03-10 | Disposition: A | Discharge status: Home / Self Care | Payer: BLUE CROSS/BLUE SHIELD | Attending: Emergency Medicine | Admitting: Emergency Medicine

## 2022-03-09 DIAGNOSIS — M542 Cervicalgia: Secondary | ICD-10-CM | POA: Diagnosis present

## 2022-03-09 DIAGNOSIS — M5412 Radiculopathy, cervical region: Secondary | ICD-10-CM | POA: Diagnosis not present

## 2022-03-09 NOTE — ED Triage Notes (Signed)
Pt arrives with c/o left sided neck pain that started a few days ago. Per pt, the pain is sharp in nature radiates down her arm.Pt denies CP or SOB.

## 2022-03-10 ENCOUNTER — Telehealth: Payer: Self-pay

## 2022-03-10 ENCOUNTER — Telehealth: Payer: Self-pay | Admitting: Internal Medicine

## 2022-03-10 MED ORDER — PREDNISONE 20 MG PO TABS
40.0000 mg | ORAL_TABLET | Freq: Once | ORAL | Status: AC
  Administered 2022-03-10: 40 mg via ORAL
  Filled 2022-03-10: qty 2

## 2022-03-10 MED ORDER — HYDROCODONE-ACETAMINOPHEN 5-325 MG PO TABS: 1.0000 | ORAL_TABLET | Freq: Four times a day (QID) | ORAL | 0 refills | Status: DC | PRN

## 2022-03-10 MED ORDER — PREDNISONE 10 MG PO TABS: 20.0000 mg | ORAL_TABLET | Freq: Two times a day (BID) | ORAL | 0 refills | Status: AC

## 2022-03-10 NOTE — Telephone Encounter (Signed)
Spoke with Melanie Ewing and Melanie Ewing states she currently has an appt with a different PCP due to insurance. But Melanie Ewing wasn't sure if she is going to come back to Dr.Jones wants to wait till she has this new appt then will call us back about who she will stay with.

## 2022-03-10 NOTE — ED Provider Notes (Signed)
[facility] Provider Note   CSN: [id number] Arrival date & time: 03/09/22  2235     History  Chief Complaint  Patient presents with   Neck Pain    Melanie Ewing is a 67 y.o. female.  Patient is a 67 year old female with past medical history of fibroids, arthritis.  Patient presenting today with complaints of neck pain.  This has been ongoing for the past several days.  She describes pain to the left side of her neck that radiates down her left arm.  She is not having any weakness, but does describe some numbness throughout the arm.  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  Pain somewhat relieved with holding her arm over her head.  She has been taking ibuprofen at home with little relief.  The history is provided by the patient.       Home Medications Prior to Admission medications   Medication Sig Start Date End Date Taking? Authorizing Provider  etodolac (LODINE XL) 400 MG 24 hr tablet Take 1 tablet (400 mg total) by mouth daily. 11/07/20   Janith Lima, MD  levocetirizine (XYZAL) 5 MG tablet Take 1 tablet (5 mg total) by mouth every evening. Patient not taking: Reported on 09/21/2021 08/05/21   Girtha Rm, NP-C  Multiple Vitamin (MULTIVITAMIN) tablet Take 1 tablet by mouth daily.    [provider]  traMADol (ULTRAM) 50 MG tablet Take 1 tablet (50 mg total) by mouth every 6 (six) hours as needed. 09/21/21   Janith Lima, MD  umeclidinium-vilanterol [facility] ELLIPTA) 62.5-25 MCG/ACT AEPB Inhale 1 puff into the lungs daily at 6 (six) AM. 09/24/21   Janith Lima, MD      Allergies    Patient has no known allergies.    Review of Systems   Review of Systems  All other systems reviewed and are negative.   Physical Exam Updated Vital Signs BP (!) 137/58   Pulse 75   Temp 98.1 F (36.7 C) (Oral)   Resp 18   Wt 90.7 kg   SpO2 99%   BMI 35.43 kg/m  Physical  Exam Vitals and nursing note reviewed.  Constitutional:      General: She is not in acute distress.    Appearance: Normal appearance. She is not ill-appearing.  HENT:     Head: Normocephalic and atraumatic.  Neck:     Comments: There is some tenderness to palpation in the soft tissues of the left cervical region. Pulmonary:     Effort: Pulmonary effort is normal.  Skin:    General: Skin is warm and dry.  Neurological:     Mental Status: She is alert and oriented to person, place, and time.     Comments: Strength is 5 out of 5 in both upper extremities with no deficits in sensation.     ED Results / Procedures / Treatments   Labs (all labs ordered are listed, but only abnormal results are displayed) Labs Reviewed - No data to display  EKG None  Radiology No results found.  Procedures Procedures    Medications Ordered in ED Medications  predniSONE (DELTASONE) tablet 40 mg (has no administration in time range)    ED Course/ Medical Decision Making/ A&P  Patient presenting with left-sided neck pain radiating down her arm and associated arm numbness consistent with a cervical radiculopathy.  She has no deficits in strength.  She will be treated with prednisone, pain medication, and follow-up with primary doctor if not improving in the next week.  I see no red flags in her history or exam findings that would suggest emergent imaging is indicated.  Final Clinical Impression(s) / ED Diagnoses Final diagnoses:  None    Rx / DC Orders ED Discharge Orders     None         Veryl Speak, MD 03/10/22 [id number]

## 2022-03-10 NOTE — Discharge Instructions (Signed)
Begin taking prednisone as prescribed.  Begin taking hydrocodone as prescribed as needed for pain.  Follow-up with your primary care doctor if not improving in the next week.

## 2022-03-10 NOTE — Telephone Encounter (Signed)
Patient said she was returning a call. The reason for her recent hospital visit was a pinched nerve. Best callback number is [phone].

## 2022-03-10 NOTE — Transitions of Care (Post Inpatient/ED Visit) (Signed)
   03/10/2022  Name: RECHEL DELOSREYES MRN: [id number] DOB: Mar 23, 1955  Today's TOC FU Call Status: Today's TOC FU Call Status:: Successful TOC FU Call Competed TOC FU Call Complete Date: 03/10/22  Transition Care Management Follow-up Telephone Call Date of Discharge: 03/10/22 Discharge Facility: [facility] Type of Discharge: Emergency Department Reason for ED Visit: Other: (cervical radiculopathy) How have you been since you were released from the hospital?: Better Any questions or concerns?: No  Items Reviewed: Did you receive and understand the discharge instructions provided?: Yes Medications obtained and verified?: Yes (Medications Reviewed) Any new allergies since your discharge?: No Dietary orders reviewed?: NA Do you have support at home?: Yes People in Home: spouse  Home Care and Equipment/Supplies: [place] Ordered?: NA Any new equipment or medical supplies ordered?: NA  Functional Questionnaire: Do you need assistance with bathing/showering or dressing?: No Do you need assistance with meal preparation?: No Do you need assistance with eating?: No Do you have difficulty maintaining continence: No Do you need assistance with getting out of bed/getting out of a chair/moving?: No Do you have difficulty managing or taking your medications?: No  Folllow up appointments reviewed: PCP Follow-up appointment confirmed?: No (transferred to [place]) MD Provider Line Number:[phone] Given: No [facility] Follow-up appointment confirmed?: NA Do you need transportation to your follow-up appointment?: No Do you understand care options if your condition(s) worsen?: Yes-patient verbalized understanding    [place], [place] Direct Dial [phone]

## 2022-06-04 ENCOUNTER — Encounter (HOSPITAL_BASED_OUTPATIENT_CLINIC_OR_DEPARTMENT_OTHER): Payer: Self-pay | Admitting: Emergency Medicine

## 2022-06-04 ENCOUNTER — Emergency Department (HOSPITAL_BASED_OUTPATIENT_CLINIC_OR_DEPARTMENT_OTHER): Payer: BLUE CROSS/BLUE SHIELD

## 2022-06-04 ENCOUNTER — Emergency Department (HOSPITAL_BASED_OUTPATIENT_CLINIC_OR_DEPARTMENT_OTHER)
Admission: EM | Admit: 2022-06-04 | Discharge: 2022-06-04 | Disposition: A | Discharge status: Home / Self Care | Payer: BLUE CROSS/BLUE SHIELD | Attending: Emergency Medicine | Admitting: Emergency Medicine

## 2022-06-04 ENCOUNTER — Other Ambulatory Visit: Payer: Self-pay

## 2022-06-04 DIAGNOSIS — M25561 Pain in right knee: Secondary | ICD-10-CM

## 2022-06-04 MED ORDER — ETODOLAC 400 MG PO TABS: 400.0000 mg | ORAL_TABLET | Freq: Two times a day (BID) | ORAL | 0 refills | Status: AC

## 2022-06-04 MED ORDER — OXYCODONE HCL 5 MG PO TABS: 5.0000 mg | ORAL_TABLET | ORAL | 0 refills | Status: AC | PRN

## 2022-06-04 MED ORDER — KETOROLAC TROMETHAMINE 15 MG/ML IJ SOLN
15.0000 mg | Freq: Once | INTRAMUSCULAR | Status: AC
  Administered 2022-06-04: 15 mg via INTRAMUSCULAR
  Filled 2022-06-04: qty 1

## 2022-06-04 NOTE — ED Provider Notes (Signed)
[place] [facility] Provider Note   CSN: [id number] Arrival date & time: 06/04/22  2058     History  Chief Complaint  Patient presents with   Knee Pain    Melanie Ewing is a 67 y.o. female.  67 year old female presents today for evaluation of right knee pain.  Symptoms ongoing for about 1 week.  Pain is intermittent.  Has not tried anything prior to arrival.  She is primarily concerned about gout.  Does not have any joint swelling.  No prior history of gout.  She states some of her friends and family members mentioned this and she got concerned.  She is able to ambulate.  No fever.  No injury to the knee.  The history is provided by the patient. No language interpreter was used.       Home Medications Prior to Admission medications   Medication Sig Start Date End Date Taking? Authorizing Provider  etodolac (LODINE XL) 400 MG 24 hr tablet Take 1 tablet (400 mg total) by mouth daily. 11/07/20   Etta Grandchild, MD  HYDROcodone-acetaminophen (NORCO) 5-325 MG tablet Take 1-2 tablets by mouth every 6 (six) hours as needed. 03/10/22   Geoffery Lyons, MD  levocetirizine (XYZAL) 5 MG tablet Take 1 tablet (5 mg total) by mouth every evening. Patient not taking: Reported on 09/21/2021 08/05/21   Avanell Shackleton, NP-C  Multiple Vitamin (MULTIVITAMIN) tablet Take 1 tablet by mouth daily.    [provider]  predniSONE (DELTASONE) 10 MG tablet Take 2 tablets (20 mg total) by mouth 2 (two) times daily. 03/10/22   Geoffery Lyons, MD  traMADol (ULTRAM) 50 MG tablet Take 1 tablet (50 mg total) by mouth every 6 (six) hours as needed. 09/21/21   Etta Grandchild, MD  umeclidinium-vilanterol [facility] ELLIPTA) 62.5-25 MCG/ACT AEPB Inhale 1 puff into the lungs daily at 6 (six) AM. 09/24/21   Etta Grandchild, MD      Allergies    Patient has no known allergies.    Review of Systems   Review of Systems  Constitutional:  Negative for fever.  Musculoskeletal:   Positive for arthralgias. Negative for joint swelling.  All other systems reviewed and are negative.   Physical Exam Updated Vital Signs BP (!) 152/93 (BP Location: Right Arm)   Pulse 71   Temp 98.4 F (36.9 C) (Oral)   Resp 18   SpO2 98%  Physical Exam Vitals and nursing note reviewed.  Constitutional:      General: She is not in acute distress.    Appearance: Normal appearance. She is not ill-appearing.  HENT:     Head: Normocephalic and atraumatic.     Nose: Nose normal.  Eyes:     Conjunctiva/sclera: Conjunctivae normal.  Cardiovascular:     Rate and Rhythm: Normal rate.  Pulmonary:     Effort: Pulmonary effort is normal. No respiratory distress.  Musculoskeletal:        General: No deformity.  Skin:    Findings: No rash.  Neurological:     Mental Status: She is alert.     ED Results / Procedures / Treatments   Labs (all labs ordered are listed, but only abnormal results are displayed) Labs Reviewed - No data to display  EKG None  Radiology DG Knee Complete 4 Views Right  Result Date: 06/04/2022 CLINICAL DATA:  Right knee pain for 1 week. EXAM: RIGHT KNEE - COMPLETE 4+ VIEW COMPARISON:  None Available. FINDINGS:  No evidence of fracture, dislocation, or joint effusion. Trace peripheral spurring. No erosive change. No focal bone lesion or bone destruction. Soft tissues are unremarkable. IMPRESSION: Trace degenerative peripheral spurring.  No acute findings. Electronically Signed   By: Narda Rutherford M.D.   On: 06/04/2022 21:21    Procedures Procedures    Medications Ordered in ED Medications - No data to display  ED Course/ Medical Decision Making/ A&P                             Medical Decision Making Amount and/or Complexity of Data Reviewed Radiology: ordered.   67 year old female presents today for concern of right knee pain.  Ongoing intermittently for the past week.  No fever.  Good range of motion.  Able to ambulate.  Tenderness to palpation  only on the medial aspect of the knee.  No suspicion for septic joint given she is without diffuse tenderness, without redness or warmth.  Afebrile.  And she is able to ambulate and has good range of motion.  Also low suspicion for gout for similar reasons and she has no prior history of gout.  Likely inflammatory process or tendinitis.  Discussed anti-inflammatory.  Will give short course of pain medication.  Will give referral to sports medicine.  She is in agreement with this plan.  Strict return precautions given.   Final Clinical Impression(s) / ED Diagnoses Final diagnoses:  Right knee pain, unspecified chronicity    Rx / DC Orders ED Discharge Orders          Ordered    etodolac (LODINE) 400 MG tablet  2 times daily        06/04/22 2219    oxyCODONE (ROXICODONE) 5 MG immediate release tablet  Every 4 hours PRN        06/04/22 2219              Marita Kansas, PA-C 06/04/22 2220    Vanetta Mulders, MD 06/06/22 1630

## 2022-06-04 NOTE — Discharge Instructions (Addendum)
Your workup today was reassuring.  X-ray did not show any concerning findings.  I have sent in an anti-inflammatory medication as well as pain medication to the pharmacy for you.  I have given you referral to Dr. Jordan Likes who is a sports medicine doctor.  If you do not have improvement you can follow-up with him.  As discussed your exam is not consistent with gout.  If you have worsening symptoms return to the emergency room.

## 2022-06-04 NOTE — ED Triage Notes (Signed)
Pt here for R knee pain x 1 week. Pt is concerned she has gout. Pt states she has used ice and ibuprofen with temporary relief, pain is intermittent. Pt is ambulatory.

## 2022-06-06 ENCOUNTER — Telehealth: Payer: Self-pay

## 2022-06-06 NOTE — Transitions of Care (Post Inpatient/ED Visit) (Signed)
06/06/2022  Name: Melanie Ewing MRN: [id number] DOB: 1955-11-09  Today's TOC FU Call Status: Today's TOC FU Call Status:: Successful TOC FU Call Competed TOC FU Call Complete Date: 06/06/22  Transition Care Management Follow-up Telephone Call Date of Discharge: 06/04/22 Discharge Facility: [facility] Type of Discharge: Emergency Department Reason for ED Visit: Orthopedic Conditions Orthopedic/Injury Diagnosis:  (tendinitis or inflammatory) How have you been since you were released from the hospital?: Same Any questions or concerns?: No  Items Reviewed: Did you receive and understand the discharge instructions provided?: Yes Medications obtained,verified, and reconciled?: Yes (Medications Reviewed) Any new allergies since your discharge?: No Dietary orders reviewed?: NA Do you have support at home?: Yes (children come everyday) People in Home: alone  Medications Reviewed Today: Medications Reviewed Today     Reviewed by Etta Grandchild, MD (Physician) on 09/21/21 at 1631  Med List Status: <None>   Medication Order Taking? Sig Documenting Provider Last Dose Status Informant  Budeson-Glycopyrrol-Formoterol (BREZTRI AEROSPHERE) 160-9-4.8 MCG/ACT AERO [id number] Yes Inhale 2 puffs into the lungs 2 (two) times daily. Etta Grandchild, MD Taking Active   etodolac (LODINE XL) 400 MG 24 hr tablet [id number] Yes Take 1 tablet (400 mg total) by mouth daily. Etta Grandchild, MD Taking Active   levocetirizine (XYZAL) 5 MG tablet [id number] No Take 1 tablet (5 mg total) by mouth every evening.  Patient not taking: Reported on 09/21/2021   Avanell Shackleton, NP-C Not Taking Active   Multiple Vitamin (MULTIVITAMIN) tablet [id number] Yes Take 1 tablet by mouth daily. [provider] Taking Active   traMADol (ULTRAM) 50 MG tablet [id number]  Take 1 tablet (50 mg total) by mouth every 6 (six) hours as needed. Etta Grandchild, MD  Active   triamcinolone cream (KENALOG) 0.1 %  [id number] No Apply 1 application topically 2 (two) times daily.  Patient not taking: Reported on 09/21/2021   Corwin Levins, MD Not Taking Active             Home Care and Equipment/Supplies: Were Home Health Services Ordered?: No Any new equipment or medical supplies ordered?: No  Functional Questionnaire: Do you need assistance with bathing/showering or dressing?: No Do you need assistance with meal preparation?: No Do you need assistance with eating?: No Do you have difficulty maintaining continence: No Do you need assistance with getting out of bed/getting out of a chair/moving?: No Do you have difficulty managing or taking your medications?: No  Follow up appointments reviewed: PCP Follow-up appointment confirmed?: No MD Provider Line [phone] Given: Yes Specialist Hospital Follow-up appointment confirmed?: No Date of Specialist follow-up appointment?:  (pt says [facility] is out of network for her:the specialist is [facility] provider.) Reason Specialist Follow-Up Not Confirmed: Patient has Specialist Provider Number and will Call for Appointment (pt calling [employer] customer service to get sports med and urgent care in network for her) Do you need transportation to your follow-up appointment?: No Do you understand care options if your condition(s) worsen?: Yes-patient verbalized understanding  Pt encouraged to take the Lodine prescribed to help with pain. She relays she is still having intermittent rt knee sharp pain. She was referred to Sports Medicine but she relays the specialist is OUT of her network and will cost. I encouraged pt to call customer service number on the back of her insurance card and find specialist in network as well as Urgent Care/ER in network. She relays she was charged extra at the ER as also out of network.  Pt also relays her PC is out of network now and will ask her insurance company about this.   SIGNATURE Vicente Males, LPN [facility] AWV Team Direct  Dial: [phone]

## 2022-07-08 ENCOUNTER — Other Ambulatory Visit: Payer: Self-pay | Admitting: Internal Medicine

## 2022-07-08 DIAGNOSIS — M4802 Spinal stenosis, cervical region: Secondary | ICD-10-CM

## 2022-07-08 DIAGNOSIS — M170 Bilateral primary osteoarthritis of knee: Secondary | ICD-10-CM

## 2022-07-08 DIAGNOSIS — M159 Polyosteoarthritis, unspecified: Secondary | ICD-10-CM

## 2022-07-08 DIAGNOSIS — M5412 Radiculopathy, cervical region: Secondary | ICD-10-CM

## 2022-07-08 DIAGNOSIS — G8929 Other chronic pain: Secondary | ICD-10-CM

## 2022-07-08 DIAGNOSIS — M5136 Other intervertebral disc degeneration, lumbar region: Secondary | ICD-10-CM

## 2023-10-10 ENCOUNTER — Other Ambulatory Visit: Payer: Self-pay | Admitting: Family Medicine

## 2023-10-10 DIAGNOSIS — J309 Allergic rhinitis, unspecified: Secondary | ICD-10-CM
